# Patient Record
Sex: Male | Born: 1985 | Race: White | Hispanic: No | Marital: Married | Smoking: Never smoker
Health system: Southern US, Community
[De-identification: ages and names within clinical notes are randomized; demographics above are authoritative.]

## PROBLEM LIST (undated history)

## (undated) DIAGNOSIS — Z789 Other specified health status: Secondary | ICD-10-CM

## (undated) DIAGNOSIS — Z8619 Personal history of other infectious and parasitic diseases: Secondary | ICD-10-CM

## (undated) HISTORY — DX: Other specified health status: Z78.9

## (undated) HISTORY — DX: Personal history of other infectious and parasitic diseases: Z86.19

---
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
## 2004-08-09 ENCOUNTER — Ambulatory Visit: Payer: Self-pay | Admitting: Family Medicine

## 2009-01-16 ENCOUNTER — Emergency Department (HOSPITAL_COMMUNITY): Admission: EM | Admit: 2009-01-16 | Discharge: 2009-01-16 | Payer: Self-pay | Admitting: Emergency Medicine

## 2010-10-12 ENCOUNTER — Ambulatory Visit (INDEPENDENT_AMBULATORY_CARE_PROVIDER_SITE_OTHER): Payer: 59 | Admitting: Family Medicine

## 2010-10-12 ENCOUNTER — Ambulatory Visit: Payer: Self-pay | Admitting: Family Medicine

## 2010-10-12 ENCOUNTER — Telehealth: Payer: Self-pay | Admitting: Family Medicine

## 2010-10-12 ENCOUNTER — Encounter: Payer: Self-pay | Admitting: Family Medicine

## 2010-10-12 DIAGNOSIS — Z0000 Encounter for general adult medical examination without abnormal findings: Secondary | ICD-10-CM | POA: Insufficient documentation

## 2010-10-12 DIAGNOSIS — J309 Allergic rhinitis, unspecified: Secondary | ICD-10-CM | POA: Insufficient documentation

## 2010-10-12 NOTE — Telephone Encounter (Signed)
Please notify due for tetanus shot (last done 2000). Could come in at his convenience for Tdap or at next office visit.

## 2010-10-12 NOTE — Assessment & Plan Note (Signed)
Reviewed preventative protocols, updated unless pt declined. Will look into chart for last Tetanus - 2000.  Pt due for rpt. Will call. Had blood work done recently- advised to bring copy for records.
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
## 2010-10-12 NOTE — Assessment & Plan Note (Addendum)
Recent episode ? Bronchitis. Rest of story sounds like allergies, currently seem controlled with benadryl.  Has tried claritin and allegra per pt with poor response. May benefit from INS - will ask to return when feeling poorly for further evaluation. PF today normal, doubt asthma. Possible cat allergy, however not constant sxs.

## 2010-10-12 NOTE — Patient Instructions (Signed)
Good to meet you today.  Call us with questions. Try to get records of blood work done at work. Next time you're feeling ill, return to see [place]. I hope your wife starts feeling better. I will look into last tetanus shot and update your chart. Return to see me in 2-3 years or as needed.

## 2010-10-13 NOTE — Telephone Encounter (Signed)
Patient notified and nurse visit scheduled.

## 2010-10-14 ENCOUNTER — Ambulatory Visit (INDEPENDENT_AMBULATORY_CARE_PROVIDER_SITE_OTHER): Payer: 59 | Admitting: Family Medicine

## 2010-10-14 DIAGNOSIS — Z23 Encounter for immunization: Secondary | ICD-10-CM

## 2010-10-14 NOTE — Progress Notes (Signed)
TDap given IM left deltoid without incident.

## 2010-10-14 NOTE — Progress Notes (Signed)
  Subjective:    Patient ID: Connor Herrera, male    DOB: Apr 10, 1985, 25 y.o.   MRN: [id number]  HPI    Review of Systems     Objective:   Physical Exam        Assessment & Plan:

## 2010-10-18 ENCOUNTER — Ambulatory Visit (INDEPENDENT_AMBULATORY_CARE_PROVIDER_SITE_OTHER): Payer: 59 | Admitting: Family Medicine

## 2010-10-18 ENCOUNTER — Encounter: Payer: Self-pay | Admitting: *Deleted

## 2010-10-18 VITALS — BP 104/76 | HR 80 | Temp 99.0°F | Wt 211.0 lb

## 2010-10-18 DIAGNOSIS — H9319 Tinnitus, unspecified ear: Secondary | ICD-10-CM

## 2010-10-18 DIAGNOSIS — H9312 Tinnitus, left ear: Secondary | ICD-10-CM | POA: Insufficient documentation
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
## 2010-10-18 NOTE — Patient Instructions (Addendum)
Exam looks normal, hearing seems normal. Let's give this more time.  If worsening, let me know.  If not improving as expected by Friday, let us know.

## 2010-10-21 ENCOUNTER — Telehealth: Payer: Self-pay | Admitting: *Deleted

## 2010-10-21 DIAGNOSIS — H9312 Tinnitus, left ear: Secondary | ICD-10-CM

## 2010-10-21 NOTE — Telephone Encounter (Signed)
Pt states he was told to call today with update.  He still has ringing in his left ear and sounds are muffled.  No drainage from the ear.
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
## 2010-10-21 NOTE — Telephone Encounter (Signed)
That's fine.  I think completely appropriate. Will recommend more time, encourage pt to use ear plugs whenever planning on exposure to loud noises.   If improving with time, ok to wait.  Would want referral to ENT if ringing staying present or if worsening.

## 2010-10-21 NOTE — Telephone Encounter (Signed)
Message left advising patient to call and advise if he has had any improvement or if the ringing has stayed the same. Also advised about possible ENT referral and to expect call from [facility] next week.

## 2010-10-21 NOTE — Telephone Encounter (Signed)
Is there any improvement or change in ringing in last 2 days?  If not will want to refer to ENT (likely next week).

## 2010-10-27 NOTE — Telephone Encounter (Signed)
Addended by: [name] on: 10/27/2010 01:34 PM   Modules accepted: Orders

## 2010-10-27 NOTE — Telephone Encounter (Signed)
Have placed referral in chart.  Will route to [facility].

## 2010-10-27 NOTE — Telephone Encounter (Signed)
Pt called back, he says the ringing is still there and he now hears it in both ears.  He does want the ENT referral, wants to see someone in [place].

## 2011-02-19 IMAGING — CR DG KNEE COMPLETE 4+V*L*
4 series · 4 of 4 positions shown · non-contrast
Comparison: None

CLINICAL DATA: Laceration

LEFT KNEE - COMPLETE 4+ VIEW

[t knee ap left]
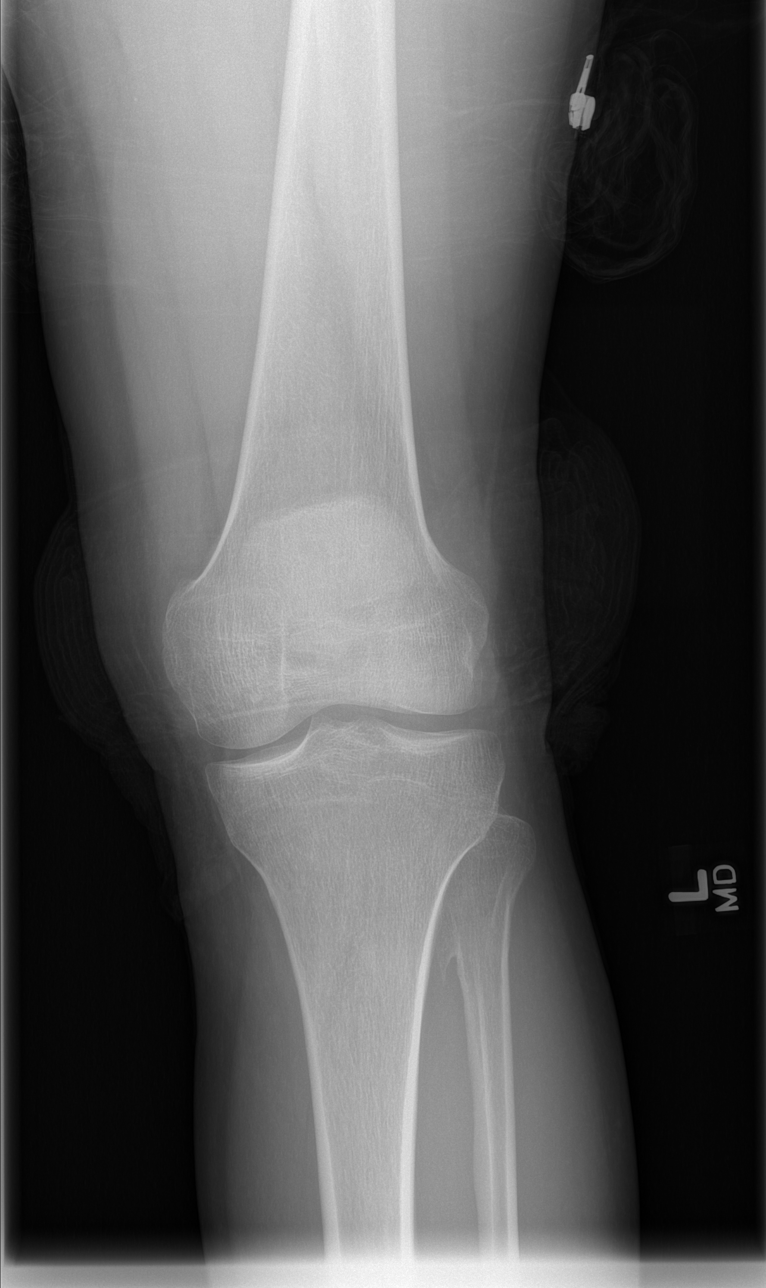

[t knee oblique left (1 of 2)]
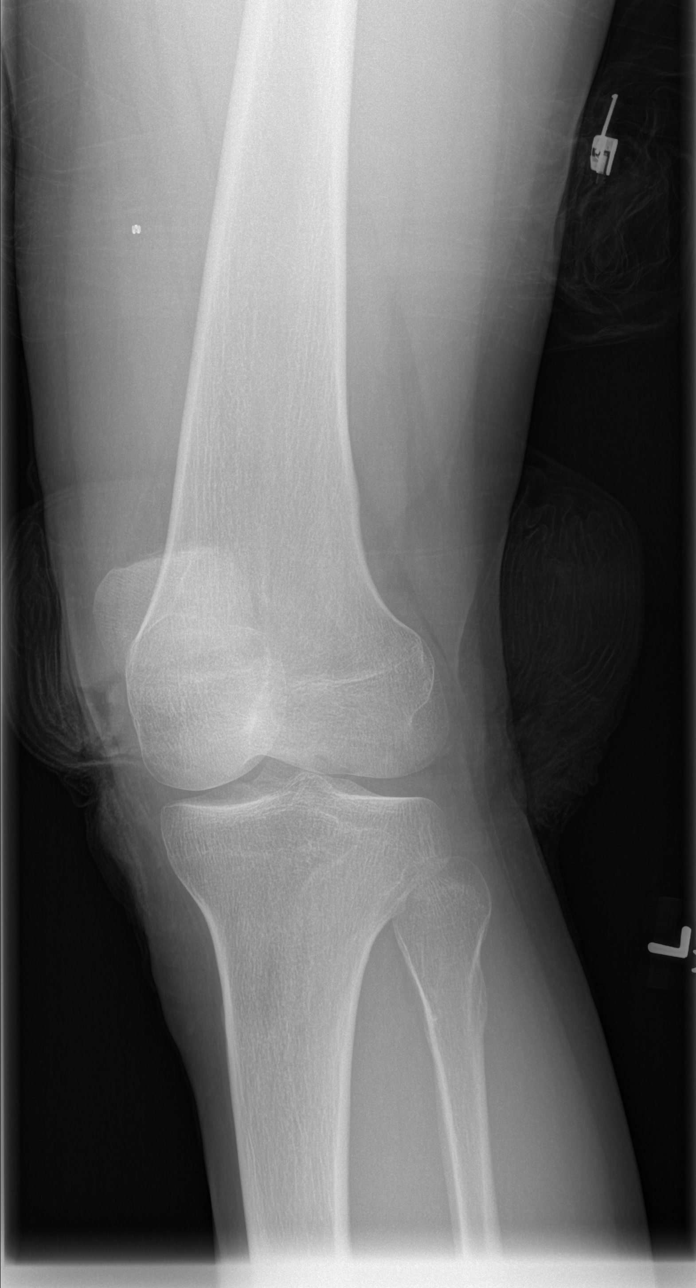

[t knee oblique left (2 of 2)]
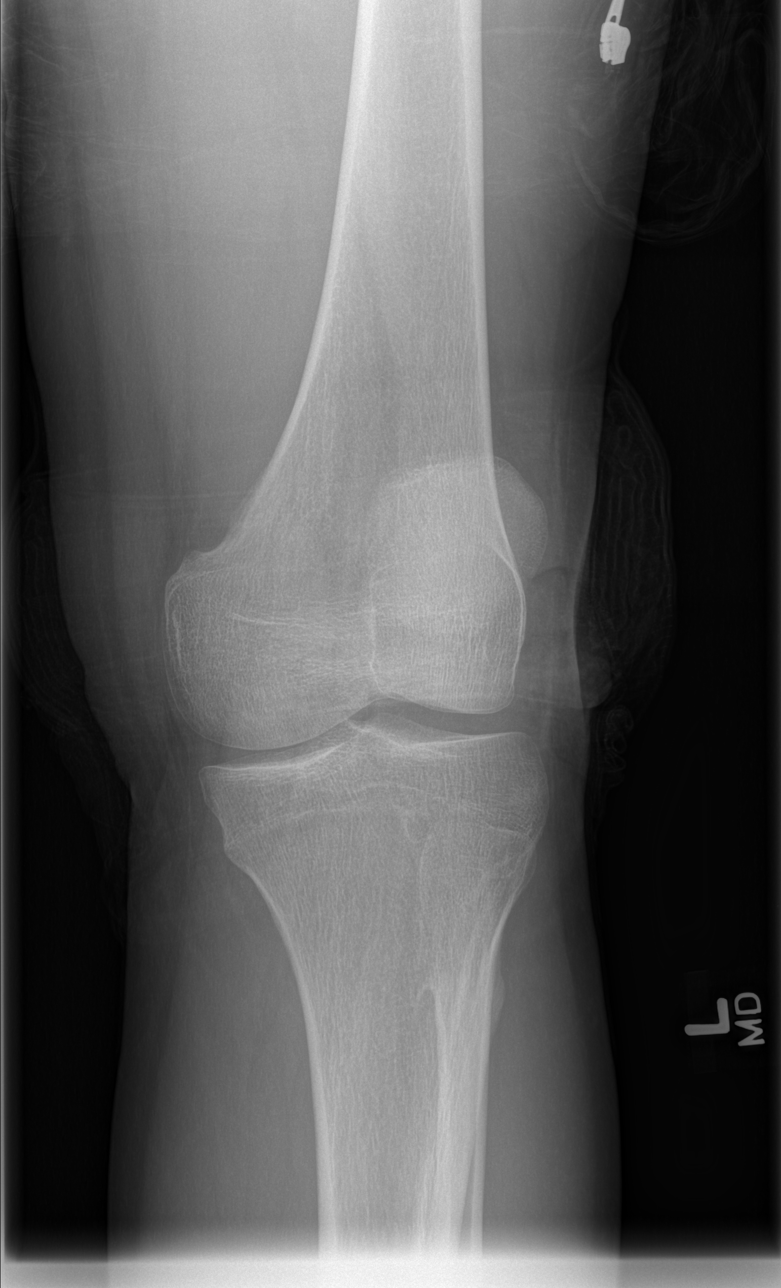

[t knee lat left]
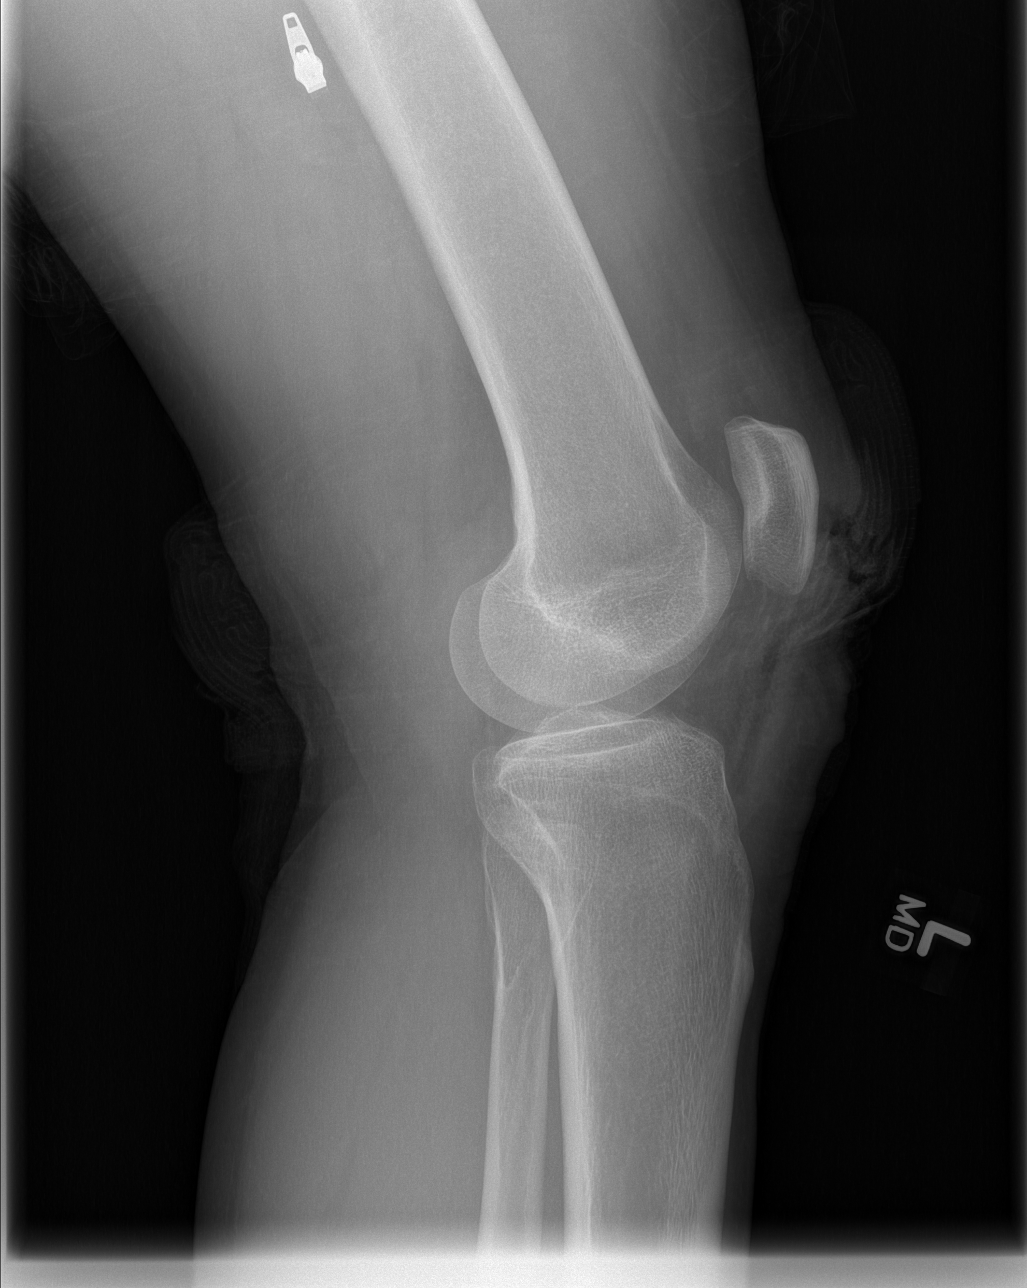

[4 of 4 positions shown; findings below may reference images not displayed]

FINDINGS: Soft tissue injury is seen anterior to the patella.  No
evidence of patellar fracture.  No radiopaque foreign object.
There is a small amount of fluid in the joint but no air seen in
the joint.  No fracture of the femur, tibia or fibula.  There is a
small osteochondroma of the proximal fibula incidentally noted.
IMPRESSION: Soft tissue injury anterior to the patella.  No sign of patellar
fracture.  Small amount of fluid in the knee joint but no sign of
air / gas in the joint.

## 2011-03-29 ENCOUNTER — Ambulatory Visit (INDEPENDENT_AMBULATORY_CARE_PROVIDER_SITE_OTHER): Payer: 59 | Admitting: Family Medicine

## 2011-03-29 ENCOUNTER — Encounter: Payer: Self-pay | Admitting: Family Medicine

## 2011-03-29 VITALS — BP 124/76 | HR 88 | Temp 98.7°F | Wt 222.8 lb

## 2011-03-29 DIAGNOSIS — J019 Acute sinusitis, unspecified: Secondary | ICD-10-CM

## 2011-03-29 MED ORDER — AMOXICILLIN 875 MG PO TABS: 875.0000 mg | ORAL_TABLET | Freq: Two times a day (BID) | ORAL | Status: AC

## 2011-03-29 NOTE — Patient Instructions (Signed)
Drink plenty of fluids, take tylenol as needed, and use nasal saline and start the amoxil today.  This should gradually improve.  Take care.  Let us know if you have other concerns.

## 2011-03-29 NOTE — Progress Notes (Signed)
Sick since [place], he'll get better but then he'll get worse.  Two kids in daycare.  Rhinorrhea, cough, stuffy.  Recently with more cough and inc in sinus pressure, R sided more then L. Nonsmoker. No h/o asthma.    Meds, vitals, and allergies reviewed.   ROS: See HPI.  Otherwise, noncontributory.  GEN: nad, alert and oriented HEENT: mucous membranes moist, tm w/o erythema, chronic changes on L Tm noted, nasal exam w/o erythema, clear discharge noted,  OP with cobblestoning NECK: supple w/o LA CV: rrr.   PULM: ctab, no inc wob EXT: no edema SKIN: no acute rash

## 2011-03-29 NOTE — Assessment & Plan Note (Addendum)
Amoxil, nasal saline, rest and fluids. Nontoxic. D/w pt, he understood.

## 2011-12-19 ENCOUNTER — Ambulatory Visit (INDEPENDENT_AMBULATORY_CARE_PROVIDER_SITE_OTHER): Payer: 59 | Admitting: Family Medicine

## 2011-12-19 ENCOUNTER — Encounter: Payer: Self-pay | Admitting: Family Medicine

## 2011-12-19 VITALS — BP 112/70 | HR 80 | Temp 98.3°F | Ht 74.0 in | Wt 224.8 lb

## 2011-12-19 DIAGNOSIS — J019 Acute sinusitis, unspecified: Secondary | ICD-10-CM

## 2011-12-19 DIAGNOSIS — Z0000 Encounter for general adult medical examination without abnormal findings: Secondary | ICD-10-CM

## 2011-12-19 DIAGNOSIS — Z23 Encounter for immunization: Secondary | ICD-10-CM

## 2011-12-19 LAB — LIPID PANEL
Cholesterol: 187 mg/dL (ref 0–200)
HDL: 36.9 mg/dL — ABNORMAL LOW (ref >39.00)
LDL Cholesterol: 131 mg/dL — ABNORMAL HIGH (ref 0–99)
Total CHOL/HDL Ratio: 5
Triglycerides: 98 mg/dL (ref 0.0–149.0)
VLDL: 19.6 mg/dL (ref 0.0–40.0)

## 2011-12-19 LAB — BASIC METABOLIC PANEL
BUN: 16 mg/dL (ref 6–23)
CO2: 28 mEq/L (ref 19–32)
Calcium: 9.9 mg/dL (ref 8.4–10.5)
Chloride: 103 mEq/L (ref 96–112)
Creatinine, Ser: 1.2 mg/dL (ref 0.4–1.5)
GFR: 78.65 mL/min (ref >60.00)
Glucose, Bld: 101 mg/dL — ABNORMAL HIGH (ref 70–99)
Potassium: 5.6 mEq/L — ABNORMAL HIGH (ref 3.5–5.1)
Sodium: 139 mEq/L (ref 135–145)

## 2011-12-19 NOTE — Assessment & Plan Note (Signed)
Preventative protocols reviewed and updated unless pt declined. Discussed healthy diet and lifestyle. Healthy 26 yo.  Recommended keep eye on weight.

## 2011-12-19 NOTE — Progress Notes (Signed)
Subjective:    Patient ID: Connor Herrera, male    DOB: [birth year], 26 y.o.   MRN: [id number]  HPI CC: CPE  Sinusitis - battling currently, for last 9 days, taking mucinex for this.  R maxillary pain as well as sinus congestion.  No fevers/chills, green mucous in AMs.  + HA and coughing.  Thinks overall improving slowly.  Prevetantive: Flu shot today. Tdap 2012. Requests screening blood work. Body mass index is 28.86 kg/(m^2).  Seat belt 100% time. Sunscreen consistently.  Wt Readings from Last 3 Encounters:  12/19/11 224 lb 12 oz (101.946 kg)  03/29/11 222 lb 12 oz (101.039 kg)  10/18/10 211 lb (95.709 kg)   Caffeine: 3 cups soda/day Lives with wife, 2 children, 2 cats, 3 dogs Occupation: works at [employer], Public house manager Activity: chasing kids, hunts, no regular exercise Diet: fruits, vegetables daily, lots of water, small portion sizes, red meat 3x/wk, fish 1x/wk  Medications and allergies reviewed and updated in chart.  Past histories reviewed and updated if relevant as below. Patient Active Problem List  Diagnosis  . Healthcare maintenance  . Tinnitus, left  . Sinusitis acute   Past Medical History  Diagnosis Date  . History of chicken pox    Past Surgical History  Procedure Date  . Wrist surgery 2002    left fracture skateboarding  . Inner ear surgery 2001    left traumatic ruptured TM, lacross ball, residual tinnitus   History  Substance Use Topics  . Smoking status: Never Smoker   . Smokeless tobacco: Never Used  . Alcohol Use: Yes     Comment: 6 drinks/weekend   Family History  Problem Relation Age of Onset  . Arthritis Paternal Grandmother   . Diabetes Paternal Grandmother   . Cancer Paternal Grandmother     breast  . Kidney disease Paternal Grandfather   . Coronary artery disease Paternal Grandfather 31  . Diabetes Father   . Stroke Neg Hx    No Known Allergies Current Outpatient Prescriptions on File Prior to Visit  Medication Sig  Dispense Refill  . Phenylephrine-DM-GG-APAP (MUCINEX FAST-MAX COLD FLU PO) Take by mouth as needed.          Review of Systems  Constitutional: Negative for fever, chills, activity change, appetite change, fatigue and unexpected weight change.  HENT: Negative for hearing loss and neck pain.   Eyes: Negative for visual disturbance.  Respiratory: Positive for cough. Negative for chest tightness, shortness of breath and wheezing.   Cardiovascular: Negative for chest pain, palpitations and leg swelling.  Gastrointestinal: Negative for nausea, vomiting, abdominal pain, diarrhea, constipation, blood in stool and abdominal distention.  Genitourinary: Negative for hematuria and difficulty urinating.  Musculoskeletal: Negative for myalgias and arthralgias.  Skin: Negative for rash.  Neurological: Positive for headaches. Negative for dizziness, seizures and syncope.  Hematological: Negative for adenopathy. Does not bruise/bleed easily.  Psychiatric/Behavioral: Negative for dysphoric mood. The patient is not nervous/anxious.        Objective:   Physical Exam  Nursing note and vitals reviewed. Constitutional: He is oriented to person, place, and time. He appears well-developed and well-nourished. No distress.  HENT:  Head: Normocephalic and atraumatic.  Right Ear: Hearing, tympanic membrane, external ear and ear canal normal.  Left Ear: Hearing, tympanic membrane, external ear and ear canal normal.  Nose: Nose normal.  Mouth/Throat: Oropharynx is clear and moist. No oropharyngeal exudate.  Eyes: Conjunctivae normal and EOM are normal. Pupils are equal, round, and reactive to light.  No scleral icterus.  Neck: Normal range of motion. Neck supple. No thyromegaly present.  Cardiovascular: Normal rate, regular rhythm, normal heart sounds and intact distal pulses.   No murmur heard. Pulses:      Radial pulses are 2+ on the right side, and 2+ on the left side.  Pulmonary/Chest: Effort normal and  breath sounds normal. No respiratory distress. He has no wheezes. He has no rales.  Abdominal: Soft. Bowel sounds are normal. He exhibits no distension and no mass. There is no tenderness. There is no rebound and no guarding.  Musculoskeletal: Normal range of motion. He exhibits no edema.  Lymphadenopathy:    He has no cervical adenopathy.  Neurological: He is alert and oriented to person, place, and time.       CN grossly intact, station and gait intact  Skin: Skin is warm and dry. No rash noted.  Psychiatric: He has a normal mood and affect. His behavior is normal. Judgment and thought content normal.       Assessment & Plan:

## 2011-12-19 NOTE — Assessment & Plan Note (Signed)
Of 8-9d duration.  Per pt, improving on its own. Discussed if persisting past 10 d or any sudden worsening, or fever >101, to call us for abx.

## 2011-12-19 NOTE — Patient Instructions (Addendum)
Blood work today. Flu shot today. Good to see you today, call us with questions. We will hold on to form until blood work returns then fax in for you.

## 2011-12-20 ENCOUNTER — Other Ambulatory Visit: Payer: Self-pay | Admitting: Family Medicine

## 2011-12-20 DIAGNOSIS — E875 Hyperkalemia: Secondary | ICD-10-CM

## 2011-12-26 ENCOUNTER — Other Ambulatory Visit (INDEPENDENT_AMBULATORY_CARE_PROVIDER_SITE_OTHER): Payer: 59

## 2011-12-26 DIAGNOSIS — E875 Hyperkalemia: Secondary | ICD-10-CM

## 2013-02-13 DIAGNOSIS — Z789 Other specified health status: Secondary | ICD-10-CM

## 2013-02-13 HISTORY — DX: Other specified health status: Z78.9

## 2013-03-01 ENCOUNTER — Encounter (HOSPITAL_COMMUNITY): Payer: Self-pay | Admitting: Emergency Medicine

## 2013-03-01 ENCOUNTER — Emergency Department (HOSPITAL_COMMUNITY)
Admission: EM | Admit: 2013-03-01 | Discharge: 2013-03-01 | Disposition: A | Discharge status: Home / Self Care | Payer: 59 | Attending: Emergency Medicine | Admitting: Emergency Medicine

## 2013-03-01 DIAGNOSIS — F1010 Alcohol abuse, uncomplicated: Secondary | ICD-10-CM

## 2013-03-01 DIAGNOSIS — Z8619 Personal history of other infectious and parasitic diseases: Secondary | ICD-10-CM | POA: Insufficient documentation

## 2013-03-01 LAB — GLUCOSE, CAPILLARY: Glucose-Capillary: 93 mg/dL (ref 70–99)

## 2013-03-01 LAB — BASIC METABOLIC PANEL
BUN: 15 mg/dL (ref 6–23)
CHLORIDE: 97 meq/L (ref 96–112)
CO2: 21 mEq/L (ref 19–32)
Calcium: 8.5 mg/dL (ref 8.4–10.5)
Creatinine, Ser: 1.03 mg/dL (ref 0.50–1.35)
GFR calc non Af Amer: >90 mL/min (ref >90)
Glucose, Bld: 90 mg/dL (ref 70–99)
POTASSIUM: 3.8 meq/L (ref 3.7–5.3)
Sodium: 137 mEq/L (ref 137–147)

## 2013-03-01 LAB — CBC WITH DIFFERENTIAL/PLATELET
BASOS ABS: 0 10*3/uL (ref 0.0–0.1)
BASOS PCT: 0 % (ref 0–1)
Eosinophils Absolute: 0 10*3/uL (ref 0.0–0.7)
Eosinophils Relative: 0 % (ref 0–5)
HCT: 44.8 % (ref 39.0–52.0)
HEMOGLOBIN: 15.8 g/dL (ref 13.0–17.0)
Lymphocytes Relative: 14 % (ref 12–46)
Lymphs Abs: 1.4 10*3/uL (ref 0.7–4.0)
MCH: 32.4 pg (ref 26.0–34.0)
MCHC: 35.3 g/dL (ref 30.0–36.0)
MCV: 91.8 fL (ref 78.0–100.0)
MONOS PCT: 7 % (ref 3–12)
Monocytes Absolute: 0.6 10*3/uL (ref 0.1–1.0)
NEUTROS PCT: 79 % — AB (ref 43–77)
Neutro Abs: 7.8 10*3/uL — ABNORMAL HIGH (ref 1.7–7.7)
Platelets: 203 10*3/uL (ref 150–400)
RBC: 4.88 MIL/uL (ref 4.22–5.81)
RDW: 13 % (ref 11.5–15.5)
WBC: 9.9 10*3/uL (ref 4.0–10.5)

## 2013-03-01 LAB — ETHANOL: ALCOHOL ETHYL (B): 289 mg/dL — AB (ref 0–11)

## 2013-03-01 NOTE — ED Notes (Signed)
Pt and pt's wife comfortable with d/c and f/u instructions. No prescriptions given.

## 2013-03-01 NOTE — ED Provider Notes (Signed)
CSN: [id number]     Arrival date & time 03/01/13  2043 History   First MD Initiated Contact with Patient 03/01/13 2113     Chief Complaint  Patient presents with  . Loss of Consciousness  . Alcohol Intoxication   (Consider location/radiation/quality/duration/timing/severity/associated sxs/prior Treatment) Patient is a 28 y.o. male presenting with intoxication. The history is provided by the patient and a relative.  Alcohol Intoxication This is a new problem. The current episode started today. The problem occurs constantly. The problem has been unchanged. Pertinent negatives include no abdominal pain, anorexia, arthralgias, chest pain, chills, congestion, fatigue, fever, headaches, joint swelling, myalgias, nausea, neck pain, numbness, rash or vomiting. Nothing aggravates the symptoms. He has tried nothing for the symptoms. The treatment provided no relief.    Past Medical History  Diagnosis Date  . History of chicken pox    Past Surgical History  Procedure Laterality Date  . Wrist surgery  2002    left fracture skateboarding  . Inner ear surgery  2001    left traumatic ruptured TM, lacross ball, residual tinnitus   Family History  Problem Relation Age of Onset  . Arthritis Paternal Grandmother   . Diabetes Paternal Grandmother   . Cancer Paternal Grandmother     breast  . Kidney disease Paternal Grandfather   . Coronary artery disease Paternal Grandfather [id number]  . Diabetes Father   . Stroke Neg Hx    History  Substance Use Topics  . Smoking status: Never Smoker   . Smokeless tobacco: Never Used  . Alcohol Use: Yes     Comment: 6 drinks/weekend    Review of Systems  Constitutional: Negative for fever, chills and fatigue.  HENT: Negative for congestion and facial swelling.   Eyes: Negative for discharge and visual disturbance.  Respiratory: Negative for shortness of breath.   Cardiovascular: Negative for chest pain and palpitations.  Gastrointestinal: Negative for nausea,  vomiting, abdominal pain, diarrhea and anorexia.  Musculoskeletal: Negative for arthralgias, joint swelling, myalgias and neck pain.  Skin: Negative for color change and rash.  Neurological: Negative for tremors, syncope, numbness and headaches.  Psychiatric/Behavioral: Negative for confusion and dysphoric mood.    Allergies  Review of patient's allergies indicates no known allergies.  Home Medications   No current outpatient prescriptions on file. BP 131/91  Temp(Src) 97.7 F (36.5 C) (Oral)  Resp 16  Ht 6\' 2"  (1.88 m)  Wt 225 lb (102.059 kg)  BMI 28.88 kg/m2  SpO2 97% Physical Exam  Constitutional: He is oriented to person, place, and time. He appears well-developed and well-nourished.  HENT:  Head: Normocephalic and atraumatic.  Eyes: EOM are normal. Pupils are equal, round, and reactive to light.  Neck: Normal range of motion. Neck supple. No JVD present.  Cardiovascular: Normal rate and regular rhythm.  Exam reveals no gallop and no friction rub.   No murmur heard. Pulmonary/Chest: No respiratory distress. He has no wheezes.  Abdominal: He exhibits no distension. There is no rebound and no guarding.  Musculoskeletal: Normal range of motion.  Neurological: He is alert and oriented to person, place, and time. He has normal strength. No cranial nerve deficit or sensory deficit. GCS eye subscore is 4. GCS verbal subscore is 5. GCS motor subscore is 6. He displays no Babinski's sign on the right side. He displays no Babinski's sign on the left side.  Reflex Scores:      Tricep reflexes are 2+ on the right side and 2+ on the left side.  Bicep reflexes are 2+ on the right side and 2+ on the left side.      Brachioradialis reflexes are 2+ on the right side and 2+ on the left side.      Patellar reflexes are 2+ on the right side and 2+ on the left side.      Achilles reflexes are 2+ on the right side and 2+ on the left side. Skin: No rash noted. No pallor.  Psychiatric: He has  a normal mood and affect. His behavior is normal.    ED Course  Procedures (including critical care time) Labs Review Labs Reviewed  CBC WITH DIFFERENTIAL - Abnormal; Notable for the following:    Neutrophils Relative % 79 (*)    Neutro Abs 7.8 (*)    All other components within normal limits  GLUCOSE, CAPILLARY  BASIC METABOLIC PANEL  ETHANOL   Imaging Review No results found.  EKG Interpretation   None       MDM   1. ETOH abuse    Patient is a 28 y.o. male who presents with Loss of Consciousness and Alcohol Intoxication .  This started just prior to arrival.  Patient chopping wood all day, had about 7 alcoholic drinks, fell asleep on the toilet and fell off and landing on the floor next to the tub.  Family found him and felt like he was non responsive.   On exam patient appropriate, sober enough to make his own decisions, no midline spinal tenderness, c collar cleared, no noted signs of trauma to the face/head.  Patient ambulating and tolerating PO in the ED.  Discussed with family benefit vs risk of CT imaging of the head and neck.  Agree to return for sudden worsening pain, continued vomiting.    10:13 PM:  I have discussed the diagnosis/risks/treatment options with the patient and family and believe the pt to be eligible for discharge home to follow-up with PCP. We also discussed returning to the ED immediately if new or worsening sx occur. We discussed the sx which are most concerning that necessitate immediate return. Any new prescriptions provided to the patient are listed below.  New Prescriptions   No medications on file         Melene Plan, MD 03/01/13 2213

## 2013-03-01 NOTE — ED Notes (Signed)
Pt presents from home via [facility] with c/o syncopal episode and etoh. Per EMS pt has not eaten much today, was outdoors chopping wood all day, drank unknown amount of alcohol and had syncopal episode at 1945 this evening. Wife reports finding patient face-down on ground in bathroom and had multiple episodes of emesis s/p syncopal episode. No edema/lacerations noted to forehead, pt denies pain. Per wife, pt's two children have both tested positive for flu. Pt alert and oriented x4.

## 2013-03-01 NOTE — Discharge Instructions (Signed)
Alcohol Intoxication °Alcohol intoxication occurs when the amount of alcohol that a person has consumed impairs his or her ability to mentally and physically function. Alcohol directly impairs the normal chemical activity of the brain. Drinking large amounts of alcohol can lead to changes in mental function and behavior, and it can cause many physical effects that can be harmful.  °Alcohol intoxication can range in severity from mild to very severe. Various factors can affect the level of intoxication that occurs, such as the person's age, gender, weight, frequency of alcohol consumption, and the presence of other medical conditions (such as diabetes, seizures, or heart conditions). Dangerous levels of alcohol intoxication may occur when people drink large amounts of alcohol in a short period (binge drinking). Alcohol can also be especially dangerous when combined with certain prescription medicines or "recreational" drugs. °SIGNS AND SYMPTOMS °Some common signs and symptoms of mild alcohol intoxication include: °· Loss of coordination. °· Changes in mood and behavior. °· Impaired judgment. °· Slurred speech. °As alcohol intoxication progresses to more severe levels, other signs and symptoms will appear. These may include: °· Vomiting. °· Confusion and impaired memory. °· Slowed breathing. °· Seizures. °· Loss of consciousness. °DIAGNOSIS  °Your health care provider will take a medical history and perform a physical exam. You will be asked about the amount and type of alcohol you have consumed. Blood tests will be done to measure the concentration of alcohol in your blood. In many places, your blood alcohol level must be lower than 80 mg/dL (0.08%) to legally drive. However, many dangerous effects of alcohol can occur at much lower levels.  °TREATMENT  °People with alcohol intoxication often do not require treatment. Most of the effects of alcohol intoxication are temporary, and they go away as the alcohol naturally  leaves the body. Your health care provider will monitor your condition until you are stable enough to go home. Fluids are sometimes given through an IV access tube to help prevent dehydration.  °HOME CARE INSTRUCTIONS °· Do not drive after drinking alcohol. °· Stay hydrated. Drink enough water and fluids to keep your urine clear or pale yellow. Avoid caffeine.   °· Only take over-the-counter or prescription medicines as directed by your health care provider.   °SEEK MEDICAL CARE IF:  °· You have persistent vomiting.   °· You do not feel better after a few days. °· You have frequent alcohol intoxication. Your health care provider can help determine if you should see a substance use treatment counselor. °SEEK IMMEDIATE MEDICAL CARE IF:  °· You become shaky or tremble when you try to stop drinking.   °· You shake uncontrollably (seizure).   °· You throw up (vomit) blood. This may be bright red or may look like black coffee grounds.   °· You have blood in your stool. This may be bright red or may appear as a black, tarry, bad smelling stool.   °· You become lightheaded or faint.   °MAKE SURE YOU:  °· Understand these instructions. °· Will watch your condition. °· Will get help right away if you are not doing well or get worse. °Document Released: 11/09/2004 Document Revised: 10/02/2012 Document Reviewed: 07/05/2012 °[employer]® Patient Information ©2014 [employer]. ° °

## 2013-03-01 NOTE — ED Provider Notes (Signed)
I saw and evaluated the patient, reviewed the resident's note and I agree with the findings and plan.   .Face to face Exam:  General:  Awake HEENT:  Atraumatic Resp:  Normal effort Abd:  Nondistended Neuro:No focal weakness    Nelia Shiobert L Ilianna Bown, MD 03/01/13 2220

## 2013-03-01 NOTE — ED Notes (Signed)
Pt ambulatory in hallway without issue. ED Resident states OK for discharge at this time.

## 2013-03-02 ENCOUNTER — Encounter: Payer: Self-pay | Admitting: Family Medicine

## 2014-06-16 ENCOUNTER — Encounter: Payer: Self-pay | Admitting: Family Medicine

## 2014-06-16 ENCOUNTER — Ambulatory Visit (INDEPENDENT_AMBULATORY_CARE_PROVIDER_SITE_OTHER): Payer: 59 | Admitting: Family Medicine

## 2014-06-16 VITALS — BP 120/74 | HR 68 | Temp 98.4°F | Wt 220.4 lb

## 2014-06-16 DIAGNOSIS — S39012A Strain of muscle, fascia and tendon of lower back, initial encounter: Secondary | ICD-10-CM | POA: Diagnosis not present

## 2014-06-16 DIAGNOSIS — M545 Low back pain, unspecified: Secondary | ICD-10-CM

## 2014-06-16 LAB — POCT URINALYSIS DIPSTICK
Bilirubin, UA: NEGATIVE
Glucose, UA: NEGATIVE
Ketones, UA: NEGATIVE
Leukocytes, UA: NEGATIVE
Nitrite, UA: NEGATIVE
Protein, UA: NEGATIVE
RBC UA: NEGATIVE
UROBILINOGEN UA: 0.2
pH, UA: 6

## 2014-06-16 MED ORDER — METHOCARBAMOL 500 MG PO TABS: 500.0000 mg | ORAL_TABLET | Freq: Three times a day (TID) | ORAL | Status: DC | PRN

## 2014-06-16 NOTE — Patient Instructions (Signed)
I think you have lumbar strain of back - treat with ibuprofen 400-600mg  twice daily with food for next week, may use muscle relaxant sent to pharmacy (sedation precautions), and do exercises provided today. This should continue to get better over next 2 weeks. You could also see chiropractor if not better with above treatment. Let us know if any worsening or not improving as expected.

## 2014-06-16 NOTE — Assessment & Plan Note (Addendum)
Upper lumbar strain - treat with ibuprofen 400-600mg  twice daily with food, robaxin muscle relaxant. Lower back pain provided today. Update if not improving with treatment. Discussed chiropractor No red flags today.

## 2014-06-16 NOTE — Progress Notes (Signed)
   BP 120/74 mmHg  Pulse 68  Temp(Src) 98.4 F (36.9 C) (Oral)  Wt 220 lb 6.4 oz (99.973 kg)   CC: back pain  Subjective:    Patient ID: Connor Herrera, male    DOB: 07/23/1985, 29 y.o.   MRN: [id number]  HPI: Connor CalamityChristopher L Herrera is a 29 y.o. male presenting on 06/16/2014 for Back Pain   3-4 wk h/o lower back pain - described as dull ache intersperced with sharp pain. Points to lower thoracic region as site of pain. 1 wk ago had sharp pain left lower back that shot laterally.   Denies inciting trauma/falls or injury. No fevers/chills, shooting pain down legs, numbness or weakness of legs, bowel or bladder accidents.  No prior back issues.  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 Has been in this position for last 4 months.   Relevant past medical, surgical, family and social history reviewed and updated as indicated. Interim medical history since our last visit reviewed. Allergies and medications reviewed and updated. No current outpatient prescriptions on file prior to visit.   No current facility-administered medications on file prior to visit.    Review of Systems Per HPI unless specifically indicated above     Objective:    BP 120/74 mmHg  Pulse 68  Temp(Src) 98.4 F (36.9 C) (Oral)  Wt 220 lb 6.4 oz (99.973 kg)  Wt Readings from Last 3 Encounters:  06/16/14 220 lb 6.4 oz (99.973 kg)  03/01/13 225 lb (102.059 kg)  12/19/11 224 lb 12 oz (101.946 kg)    Physical Exam  Constitutional: He is oriented to person, place, and time. He appears well-developed and well-nourished. No distress.  Musculoskeletal: He exhibits no edema.  No pain midline spine No paraspinous mm tenderness Neg SLR bilaterally. No pain with int/ext rotation at hip. Neg FABER. No pain at SIJ, GTB or sciatic notch bilaterally.   Neurological: He is alert and oriented to person, place, and time. He has normal strength. No sensory deficit. Coordination and gait normal.  5/5 strength BLE    Nursing note and vitals reviewed.     Assessment & Plan:   Problem List Items Addressed This Visit    Lumbar strain    Upper lumbar strain - treat with ibuprofen 400-600mg  twice daily with food, robaxin muscle relaxant. Lower back pain provided today. Update if not improving with treatment. Discussed chiropractor No red flags today.       Other Visit Diagnoses    Midline low back pain without sciatica    -  Primary    Relevant Medications    methocarbamol (ROBAXIN) 500 MG tablet    Other Relevant Orders    POCT Urinalysis Dipstick        Follow up plan: Return if symptoms worsen or fail to improve.

## 2014-06-16 NOTE — Progress Notes (Signed)
Pre visit review using our clinic review tool, if applicable. No additional management support is needed unless otherwise documented below in the visit note. 

## 2014-06-17 ENCOUNTER — Encounter: Payer: Self-pay | Admitting: *Deleted

## 2015-05-13 LAB — LIPID PANEL
CHOLESTEROL: 183
HDL: 44
LDL CALC: 105
TRIGLYCERIDES: 171

## 2015-05-13 LAB — GLUCOSE, FASTING: GLUCOSE: 127

## 2015-05-20 ENCOUNTER — Ambulatory Visit (INDEPENDENT_AMBULATORY_CARE_PROVIDER_SITE_OTHER): Payer: 59 | Admitting: Family Medicine

## 2015-05-20 ENCOUNTER — Encounter: Payer: Self-pay | Admitting: Family Medicine

## 2015-05-20 ENCOUNTER — Encounter: Payer: Self-pay | Admitting: *Deleted

## 2015-05-20 VITALS — BP 114/82 | HR 72 | Temp 98.2°F | Wt 217.5 lb

## 2015-05-20 DIAGNOSIS — E781 Pure hyperglyceridemia: Secondary | ICD-10-CM

## 2015-05-20 DIAGNOSIS — R739 Hyperglycemia, unspecified: Secondary | ICD-10-CM | POA: Diagnosis not present

## 2015-05-20 DIAGNOSIS — E785 Hyperlipidemia, unspecified: Secondary | ICD-10-CM | POA: Insufficient documentation

## 2015-05-20 NOTE — Patient Instructions (Signed)
labwork today - we will call you with results.

## 2015-05-20 NOTE — Assessment & Plan Note (Signed)
Mild (from work labs) - trig 170.

## 2015-05-20 NOTE — Assessment & Plan Note (Signed)
BMI 27. Does have fmhx DM. Recheck today (BMP, A1c). Discussed healthy diet choices and lifestyle changes to better control sugar levels.

## 2015-05-20 NOTE — Progress Notes (Signed)
Pre visit review using our clinic review tool, if applicable. No additional management support is needed unless otherwise documented below in the visit note. 

## 2015-05-20 NOTE — Progress Notes (Signed)
   BP 114/82 mmHg  Pulse 72  Temp(Src) 98.2 F (36.8 C) (Oral)  Wt 217 lb 8 oz (98.657 kg)   CC: check sugars  Subjective:    Patient ID: Connor Herrera, male    DOB: [birth year], 30 y.o.   MRN: [id number]  HPI: Connor Herrera is a 30 y.o. male presenting on 05/20/2015 for Follow-up   Brings screening results from work - normal cholesterol levels except for trig 171. LDL 105. Fasting glucose 127.   Weight stable. Using isogenic's shake twice daily.  No regular exercise. Some walking.  Good water daily, fruits/vegetables daily through shake.   Father with diabetes.   Denies polyphagia, polydipsia, polyuria. No unexpected weight changes  Relevant past medical, surgical, family and social history reviewed and updated as indicated. Interim medical history since our last visit reviewed. Allergies and medications reviewed and updated. No current outpatient prescriptions on file prior to visit.   No current facility-administered medications on file prior to visit.    Review of Systems Per HPI unless specifically indicated in ROS section     Objective:    BP 114/82 mmHg  Pulse 72  Temp(Src) 98.2 F (36.8 C) (Oral)  Wt 217 lb 8 oz (98.657 kg)  Wt Readings from Last 3 Encounters:  05/20/15 217 lb 8 oz (98.657 kg)  06/16/14 220 lb 6.4 oz (99.973 kg)  03/01/13 225 lb (102.059 kg)   Body mass index is 27.91 kg/(m^2).   Physical Exam  Constitutional: He appears well-developed and well-nourished. No distress.  HENT:  Mouth/Throat: Oropharynx is clear and moist. No oropharyngeal exudate.  Neck: No thyromegaly present.  Cardiovascular: Normal rate, regular rhythm, normal heart sounds and intact distal pulses.   No murmur heard. Pulmonary/Chest: Effort normal and breath sounds normal. No respiratory distress. He has no wheezes. He has no rales.  Musculoskeletal: He exhibits no edema.  Psychiatric: He has a normal mood and affect.  Nursing note and vitals  reviewed.      Assessment & Plan:   Problem List Items Addressed This Visit    Hyperglycemia - Primary    BMI 27. Does have fmhx DM. Recheck today (BMP, A1c). Discussed healthy diet choices and lifestyle changes to better control sugar levels.      Relevant Orders   Basic metabolic panel   Hemoglobin A1c   Hypertriglyceridemia    Mild (from work labs) - trig 170.          Follow up plan: No Follow-up on file.  Connor BoydenJavier Alaysia Lightle, MD

## 2015-05-21 LAB — BASIC METABOLIC PANEL
BUN: 20 mg/dL (ref 6–23)
CO2: 28 mEq/L (ref 19–32)
Calcium: 10 mg/dL (ref 8.4–10.5)
Chloride: 101 mEq/L (ref 96–112)
Creatinine, Ser: 1.24 mg/dL (ref 0.40–1.50)
GFR: 71.87 mL/min (ref >60.00)
Glucose, Bld: 78 mg/dL (ref 70–99)
Potassium: 4.4 mEq/L (ref 3.5–5.1)
SODIUM: 137 meq/L (ref 135–145)

## 2015-05-21 LAB — HEMOGLOBIN A1C: HEMOGLOBIN A1C: 5.6 % (ref 4.6–6.5)

## 2015-05-24 ENCOUNTER — Encounter: Payer: Self-pay | Admitting: *Deleted

## 2017-08-20 ENCOUNTER — Encounter: Payer: Self-pay | Admitting: Family Medicine

## 2017-08-20 ENCOUNTER — Ambulatory Visit: Payer: 59 | Admitting: Family Medicine

## 2017-08-20 ENCOUNTER — Encounter (INDEPENDENT_AMBULATORY_CARE_PROVIDER_SITE_OTHER): Payer: Self-pay

## 2017-08-20 VITALS — BP 126/82 | HR 86 | Temp 98.5°F | Ht 74.0 in | Wt 242.5 lb

## 2017-08-20 DIAGNOSIS — B070 Plantar wart: Secondary | ICD-10-CM | POA: Insufficient documentation

## 2017-08-20 NOTE — Progress Notes (Addendum)
   BP 126/82 (BP Location: Left Arm, Patient Position: Sitting, Cuff Size: Large)   Pulse 86   Temp 98.5 F (36.9 C) (Oral)   Ht 6\' 2"  (1.88 m)   Wt 242 lb 8 oz (110 kg)   SpO2 98%   BMI 31.14 kg/m    CC: plantar wart Subjective:    Patient ID: Connor Herrera, male    DOB: 1985-11-25, 32 y.o.   MRN: [id number]  HPI: Connor Herrera is a 32 y.o. male presenting on 08/20/2017 for Plantar Warts (Located on left foot. Noticed about 2 mos ago, Tried OTC products, no help. )   Several month h/o L plantar warts - treating with OTC acid solution, also tried freeze off without benefit. Would like further treatment today.  Relevant past medical, surgical, family and social history reviewed and updated as indicated. Interim medical history since our last visit reviewed. Allergies and medications reviewed and updated. No outpatient medications prior to visit.   No facility-administered medications prior to visit.      Per HPI unless specifically indicated in ROS section below Review of Systems     Objective:    BP 126/82 (BP Location: Left Arm, Patient Position: Sitting, Cuff Size: Large)   Pulse 86   Temp 98.5 F (36.9 C) (Oral)   Ht 6\' 2"  (1.88 m)   Wt 242 lb 8 oz (110 kg)   SpO2 98%   BMI 31.14 kg/m   Wt Readings from Last 3 Encounters:  08/20/17 242 lb 8 oz (110 kg)  05/20/15 217 lb 8 oz (98.7 kg)  06/16/14 220 lb 6.4 oz (100 kg)    Physical Exam  Constitutional: He appears well-developed and well-nourished. No distress.  Skin: Skin is warm and dry.  L sole at base of 1st MTPJ with 2 tender hyperkeratotic lesions with central core  Nursing note and vitals reviewed.  L plantar wart: Liquid nitrogen was applied for 10 seconds to the skin lesions and the expected blistering or scabbing reaction explained. Do not pick at the area. Return if lesions fail to fully resolve.     Assessment & Plan:   Problem List Items Addressed This Visit    Plantar wart of left  foot - Primary    Skin pared down with 15 blade scalpel then cryotherapy treatment performed to 2 plantar warts. Anticipated recovery reviewed. Update if not improving with treatment.           No orders of the defined types were placed in this encounter.  No orders of the defined types were placed in this encounter.   Follow up plan: No follow-ups on file.  Eustaquio BoydenJavier Mustapha Colson, MD

## 2017-08-20 NOTE — Assessment & Plan Note (Addendum)
Skin pared down with 15 blade scalpel then cryotherapy treatment performed to 2 plantar warts. Anticipated recovery reviewed. Update if not improving with treatment.

## 2017-08-20 NOTE — Patient Instructions (Addendum)
Cryotherapy performed today. Wound will blister then peel, hopefully wart will be gone.  If recurrent, may return for retreatment.  Plantar Warts Warts are small growths on the skin. They can occur on various areas of the body. When they occur on the underside (sole) of the foot, they are called plantar warts. Plantar warts often occur in groups, with several small warts around a larger growth. They tend to develop over areas of pressure, such as the heel or the ball of the foot. Most warts are not painful, and they usually do not cause problems. However, plantar warts may cause pain when you walk because pressure is applied to them. Warts often go away on their own in time. Various treatments may be done if needed. Sometimes, warts go away and then they come back again. What are the causes? Plantar warts are caused by a type of virus that is called human papillomavirus (HPV). HPV attacks a break in the skin of the foot. Walking barefoot can lead to exposure to the virus. These warts may spread to other areas of the sole. They spread to other areas of the body only through direct contact. What increases the risk? Plantar warts are more likely to develop in:  People who are 6610-34 years of age.  People who use public showers or locker rooms.  People who have a weakened body defense system (immune system).  What are the signs or symptoms? Plantar warts may be flat or slightly raised. They may grow into the deeper layers of skin or rise above the surface of the skin. Most plantar warts have a rough surface. They may cause pain when you use your foot to support your body weight. How is this diagnosed? A plantar wart can usually be diagnosed from its appearance. In some cases, a tissue sample may be removed (biopsy) to be looked at under a microscope. How is this treated? In many cases, warts do not need treatment. Without treatment, they often go away over a period of many months to a couple years.  If treatment is needed, options may include:  Applying medicated solutions, creams, or patches to the wart. These may be over-the-counter or prescription medicines that make the skin soft so that layers will gradually shed away. In many cases, the medicine is applied one or two times per day and covered with a bandage.  Putting duct tape over the top of the wart (occlusion). You will leave the tape in place for as long as told by your health care provider, then you will replace it with a new strip of tape. This is done until the wart goes away.  Freezing the wart with liquid nitrogen (cryotherapy).  Burning the wart with: ? Laser treatment. ? An electrified probe (electrocautery).  Injection of a medicine (Candida antigen) into the wart to help the body's immune system to fight off the wart.  Surgery to remove the wart.  Follow these instructions at home:  Apply medicated creams or solutions only as told by your health care provider. This may involve: ? Soaking the affected area in warm water. ? Removing the top layer of softened skin before you apply the medicine. A pumice stone works well for removing the tissue. ? Applying a bandage over the affected area after you apply the medicine. ? Repeating the process daily or as told by your health care provider.  Do not scratch or pick at a wart.  Wash your hands after you touch a wart.  If  a wart is painful, try applying a bandage with a hole in the middle over the wart. The helps to take pressure off the wart.  Keep all follow-up visits as told by your health care provider. This is important. How is this prevented? Take these actions to help prevent warts:  Wear shoes and socks. Change your socks daily.  Keep your feet clean and dry.  Check your feet regularly.  Avoid direct contact with warts on other people.  Contact a health care provider if:  Your warts do not improve after treatment.  You have redness, swelling, or  pain at the site of a wart.  You have bleeding from a wart that does not stop with light pressure.  You have diabetes and you develop a wart. This information is not intended to replace advice given to you by your health care provider. Make sure you discuss any questions you have with your health care provider. Document Released: 04/22/2003 Document Revised: 07/08/2015 Document Reviewed: 04/27/2014 Elsevier Interactive Patient Education  [employer].

## 2017-10-22 ENCOUNTER — Other Ambulatory Visit: Payer: Self-pay | Admitting: Family Medicine

## 2017-10-22 ENCOUNTER — Other Ambulatory Visit (INDEPENDENT_AMBULATORY_CARE_PROVIDER_SITE_OTHER): Payer: 59

## 2017-10-22 DIAGNOSIS — R739 Hyperglycemia, unspecified: Secondary | ICD-10-CM | POA: Diagnosis not present

## 2017-10-22 DIAGNOSIS — E781 Pure hyperglyceridemia: Secondary | ICD-10-CM

## 2017-10-22 LAB — HEMOGLOBIN A1C: Hgb A1c MFr Bld: 5.5 % (ref 4.6–6.5)

## 2017-10-22 LAB — COMPREHENSIVE METABOLIC PANEL
ALT: 38 U/L (ref 0–53)
AST: 26 U/L (ref 0–37)
Albumin: 4.4 g/dL (ref 3.5–5.2)
Alkaline Phosphatase: 67 U/L (ref 39–117)
BILIRUBIN TOTAL: 0.6 mg/dL (ref 0.2–1.2)
BUN: 15 mg/dL (ref 6–23)
CO2: 29 meq/L (ref 19–32)
Calcium: 9.3 mg/dL (ref 8.4–10.5)
Chloride: 101 mEq/L (ref 96–112)
Creatinine, Ser: 1.31 mg/dL (ref 0.40–1.50)
GFR: 66.46 mL/min (ref >60.00)
Glucose, Bld: 99 mg/dL (ref 70–99)
Potassium: 4.6 mEq/L (ref 3.5–5.1)
Sodium: 137 mEq/L (ref 135–145)
Total Protein: 7.1 g/dL (ref 6.0–8.3)

## 2017-10-22 LAB — LIPID PANEL
Cholesterol: 195 mg/dL (ref 0–200)
HDL: 53.6 mg/dL (ref >39.00)
LDL Cholesterol: 112 mg/dL — ABNORMAL HIGH (ref 0–99)
NONHDL: 141.11
Total CHOL/HDL Ratio: 4
Triglycerides: 144 mg/dL (ref 0.0–149.0)
VLDL: 28.8 mg/dL (ref 0.0–40.0)

## 2017-10-25 ENCOUNTER — Ambulatory Visit (INDEPENDENT_AMBULATORY_CARE_PROVIDER_SITE_OTHER): Payer: 59 | Admitting: Family Medicine

## 2017-10-25 ENCOUNTER — Encounter: Payer: Self-pay | Admitting: Family Medicine

## 2017-10-25 VITALS — BP 138/90 | HR 99 | Temp 98.2°F | Ht 72.5 in | Wt 238.8 lb

## 2017-10-25 DIAGNOSIS — Z23 Encounter for immunization: Secondary | ICD-10-CM

## 2017-10-25 DIAGNOSIS — Z0000 Encounter for general adult medical examination without abnormal findings: Secondary | ICD-10-CM

## 2017-10-25 DIAGNOSIS — E669 Obesity, unspecified: Secondary | ICD-10-CM | POA: Diagnosis not present

## 2017-10-25 DIAGNOSIS — R030 Elevated blood-pressure reading, without diagnosis of hypertension: Secondary | ICD-10-CM | POA: Insufficient documentation

## 2017-10-25 DIAGNOSIS — E785 Hyperlipidemia, unspecified: Secondary | ICD-10-CM | POA: Diagnosis not present

## 2017-10-25 NOTE — Assessment & Plan Note (Signed)
Preventative protocols reviewed and updated unless pt declined. Discussed healthy diet and lifestyle.  

## 2017-10-25 NOTE — Patient Instructions (Addendum)
You are doing well today.  Continue working on [employer] diet and exercise routine.  Return as needed or in 1 years for next physical.  Blood pressures a bit elevated today (140/90s) - start monitoring at home or at local pharmacy - goal <140/90. If consistently high, let me know. Work on aerobic exercise routine. Work on limiting salt/sodium in the diet, increase potassium rich foods (fruits/vegetables).   DASH Eating Plan DASH stands for "Dietary Approaches to Stop Hypertension." The DASH eating plan is a healthy eating plan that has been shown to reduce high blood pressure (hypertension). It may also reduce your risk for type 2 diabetes, heart disease, and stroke. The DASH eating plan may also help with weight loss. What are tips for following this plan? General guidelines  Avoid eating more than 2,300 mg (milligrams) of salt (sodium) a day. If you have hypertension, you may need to reduce your sodium intake to 1,500 mg a day.  Limit alcohol intake to no more than 1 drink a day for nonpregnant women and 2 drinks a day for men. One drink equals 12 oz of beer, 5 oz of wine, or 1 oz of hard liquor.  Work with your health care provider to maintain a healthy body weight or to lose weight. Ask what an ideal weight is for you.  Get at least 30 minutes of exercise that causes your heart to beat faster (aerobic exercise) most days of the week. Activities may include walking, swimming, or biking.  Work with your health care provider or diet and nutrition specialist (dietitian) to adjust your eating plan to your individual calorie needs. Reading food labels  Check food labels for the amount of sodium per serving. Choose foods with less than 5 percent of the Daily Value of sodium. Generally, foods with less than 300 mg of sodium per serving fit into this eating plan.  To find whole grains, look for the word "whole" as the first word in the ingredient list. Shopping  Buy products labeled as  "low-sodium" or "no salt added."  Buy fresh foods. Avoid canned foods and premade or frozen meals. Cooking  Avoid adding salt when cooking. Use salt-free seasonings or herbs instead of table salt or sea salt. Check with your health care provider or pharmacist before using salt substitutes.  Do not fry foods. Cook foods using healthy methods such as baking, boiling, grilling, and broiling instead.  Cook with heart-healthy oils, such as olive, canola, soybean, or sunflower oil. Meal planning   Eat a balanced diet that includes: ? 5 or more servings of fruits and vegetables each day. At each meal, try to fill half of your plate with fruits and vegetables. ? Up to 6-8 servings of whole grains each day. ? Less than 6 oz of lean meat, poultry, or fish each day. A 3-oz serving of meat is about the same size as a deck of cards. One egg equals 1 oz. ? 2 servings of low-fat dairy each day. ? A serving of nuts, seeds, or beans 5 times each week. ? Heart-healthy fats. Healthy fats called Omega-3 fatty acids are found in foods such as flaxseeds and coldwater fish, like sardines, salmon, and mackerel.  Limit how much you eat of the following: ? Canned or prepackaged foods. ? Food that is high in trans fat, such as fried foods. ? Food that is high in saturated fat, such as fatty meat. ? Sweets, desserts, sugary drinks, and other foods with added sugar. ? Full-fat dairy  products.  Do not salt foods before eating.  Try to eat at least 2 vegetarian meals each week.  Eat more home-cooked food and less restaurant, buffet, and fast food.  When eating at a restaurant, ask that your food be prepared with less salt or no salt, if possible. What foods are recommended? The items listed may not be a complete list. Talk with your dietitian about what dietary choices are best for you. Grains Whole-grain or whole-wheat bread. Whole-grain or whole-wheat pasta. Brown rice. Orpah Cobb. Bulgur. Whole-grain  and low-sodium cereals. Pita bread. Low-fat, low-sodium crackers. Whole-wheat flour tortillas. Vegetables Fresh or frozen vegetables (raw, steamed, roasted, or grilled). Low-sodium or reduced-sodium tomato and vegetable juice. Low-sodium or reduced-sodium tomato sauce and tomato paste. Low-sodium or reduced-sodium canned vegetables. Fruits All fresh, dried, or frozen fruit. Canned fruit in natural juice (without added sugar). Meat and other protein foods Skinless chicken or [place]. Ground chicken or [place]. Pork with fat trimmed off. Fish and seafood. Egg whites. Dried beans, peas, or lentils. Unsalted nuts, nut butters, and seeds. Unsalted canned beans. Lean cuts of beef with fat trimmed off. Low-sodium, lean deli meat. Dairy Low-fat (1%) or fat-free (skim) milk. Fat-free, low-fat, or reduced-fat cheeses. Nonfat, low-sodium ricotta or cottage cheese. Low-fat or nonfat yogurt. Low-fat, low-sodium cheese. Fats and oils Soft margarine without trans fats. Vegetable oil. Low-fat, reduced-fat, or light mayonnaise and salad dressings (reduced-sodium). Canola, safflower, olive, soybean, and sunflower oils. Avocado. Seasoning and other foods Herbs. Spices. Seasoning mixes without salt. Unsalted popcorn and pretzels. Fat-free sweets. What foods are not recommended? The items listed may not be a complete list. Talk with your dietitian about what dietary choices are best for you. Grains Baked goods made with fat, such as croissants, muffins, or some breads. Dry pasta or rice meal packs. Vegetables Creamed or fried vegetables. Vegetables in a cheese sauce. Regular canned vegetables (not low-sodium or reduced-sodium). Regular canned tomato sauce and paste (not low-sodium or reduced-sodium). Regular tomato and vegetable juice (not low-sodium or reduced-sodium). Rosita Fire. Olives. Fruits Canned fruit in a light or heavy syrup. Fried fruit. Fruit in cream or butter sauce. Meat and other protein foods Fatty cuts  of meat. Ribs. Fried meat. Tomasa Blase. Sausage. Bologna and other processed lunch meats. Salami. Fatback. Hotdogs. Bratwurst. Salted nuts and seeds. Canned beans with added salt. Canned or smoked fish. Whole eggs or egg yolks. Chicken or [place] with skin. Dairy Whole or 2% milk, cream, and half-and-half. Whole or full-fat cream cheese. Whole-fat or sweetened yogurt. Full-fat cheese. Nondairy creamers. Whipped toppings. Processed cheese and cheese spreads. Fats and oils Butter. Stick margarine. Lard. Shortening. Ghee. Bacon fat. Tropical oils, such as coconut, palm kernel, or palm oil. Seasoning and other foods Salted popcorn and pretzels. Onion salt, garlic salt, seasoned salt, table salt, and sea salt. Worcestershire sauce. Tartar sauce. Barbecue sauce. Teriyaki sauce. Soy sauce, including reduced-sodium. Steak sauce. Canned and packaged gravies. Fish sauce. Oyster sauce. Cocktail sauce. Horseradish that you find on the shelf. Ketchup. Mustard. Meat flavorings and tenderizers. Bouillon cubes. Hot sauce and Tabasco sauce. Premade or packaged marinades. Premade or packaged taco seasonings. Relishes. Regular salad dressings. Where to find more information:  National Heart, Lung, and Blood Institute: PopSteam.is  American Heart Association: www.heart.org Summary  The DASH eating plan is a healthy eating plan that has been shown to reduce high blood pressure (hypertension). It may also reduce your risk for type 2 diabetes, heart disease, and stroke.  With the DASH eating plan, you should limit  salt (sodium) intake to 2,300 mg a day. If you have hypertension, you may need to reduce your sodium intake to 1,500 mg a day.  When on the DASH eating plan, aim to eat more fresh fruits and vegetables, whole grains, lean proteins, low-fat dairy, and heart-healthy fats.  Work with your health care provider or diet and nutrition specialist (dietitian) to adjust your eating plan to your individual calorie  needs. This information is not intended to replace advice given to you by your health care provider. Make sure you discuss any questions you have with your health care provider. Document Released: 01/19/2011 Document Revised: 01/24/2016 Document Reviewed: 01/24/2016 Elsevier Interactive Patient Education  2018 [employer].  Health Maintenance, Male A healthy lifestyle and preventive care is important for your health and wellness. Ask your health care provider about what schedule of regular examinations is right for you. What should I know about weight and diet? Eat a Healthy Diet  Eat plenty of vegetables, fruits, whole grains, low-fat dairy products, and lean protein.  Do not eat a lot of foods high in solid fats, added sugars, or salt.  Maintain a Healthy Weight Regular exercise can help you achieve or maintain a healthy weight. You should:  Do at least 150 minutes of exercise each week. The exercise should increase your heart rate and make you sweat (moderate-intensity exercise).  Do strength-training exercises at least twice a week.  Watch Your Levels of Cholesterol and Blood Lipids  Have your blood tested for lipids and cholesterol every 5 years starting at 34 years of age. If you are at high risk for heart disease, you should start having your blood tested when you are 34 years old. You may need to have your cholesterol levels checked more often if: ? Your lipid or cholesterol levels are high. ? You are older than 34 years of age. ? You are at high risk for heart disease.  What should I know about cancer screening? Many types of cancers can be detected early and may often be prevented. Lung Cancer  You should be screened every year for lung cancer if: ? You are a current smoker who has smoked for at least 30 years. ? You are a former smoker who has quit within the past 15 years.  Talk to your health care provider about your screening options, when you should start  screening, and how often you should be screened.  Colorectal Cancer  Routine colorectal cancer screening usually begins at 34 years of age and should be repeated every 5-10 years until you are 34 years old. You may need to be screened more often if early forms of precancerous polyps or small growths are found. Your health care provider may recommend screening at an earlier age if you have risk factors for colon cancer.  Your health care provider may recommend using home test kits to check for hidden blood in the stool.  A small camera at the end of a tube can be used to examine your colon (sigmoidoscopy or colonoscopy). This checks for the earliest forms of colorectal cancer.  Prostate and Testicular Cancer  Depending on your age and overall health, your health care provider may do certain tests to screen for prostate and testicular cancer.  Talk to your health care provider about any symptoms or concerns you have about testicular or prostate cancer.  Skin Cancer  Check your skin from head to toe regularly.  Tell your health care provider about any new moles or  changes in moles, especially if: ? There is a change in a mole's size, shape, or color. ? You have a mole that is larger than a pencil eraser.  Always use sunscreen. Apply sunscreen liberally and repeat throughout the day.  Protect yourself by wearing long sleeves, pants, a wide-brimmed hat, and sunglasses when outside.  What should I know about heart disease, diabetes, and high blood pressure?  If you are 24-52 years of age, have your blood pressure checked every 3-5 years. If you are 71 years of age or older, have your blood pressure checked every year. You should have your blood pressure measured twice-once when you are at a hospital or clinic, and once when you are not at a hospital or clinic. Record the average of the two measurements. To check your blood pressure when you are not at a hospital or clinic, you can use: ? An  automated blood pressure machine at a pharmacy. ? A home blood pressure monitor.  Talk to your health care provider about your target blood pressure.  If you are between 29-72 years old, ask your health care provider if you should take aspirin to prevent heart disease.  Have regular diabetes screenings by checking your fasting blood sugar level. ? If you are at a normal weight and have a low risk for diabetes, have this test once every three years after the age of 62. ? If you are overweight and have a high risk for diabetes, consider being tested at a younger age or more often.  A one-time screening for abdominal aortic aneurysm (AAA) by ultrasound is recommended for men aged 65-75 years who are current or former smokers. What should I know about preventing infection? Hepatitis B If you have a higher risk for hepatitis B, you should be screened for this virus. Talk with your health care provider to find out if you are at risk for hepatitis B infection. Hepatitis C Blood testing is recommended for:  Everyone born from [birth year] through 1967.  Anyone with known risk factors for hepatitis C.  Sexually Transmitted Diseases (STDs)  You should be screened each year for STDs including gonorrhea and chlamydia if: ? You are sexually active and are younger than 34 years of age. ? You are older than 34 years of age and your health care provider tells you that you are at risk for this type of infection. ? Your sexual activity has changed since you were last screened and you are at an increased risk for chlamydia or gonorrhea. Ask your health care provider if you are at risk.  Talk with your health care provider about whether you are at high risk of being infected with HIV. Your health care provider may recommend a prescription medicine to help prevent HIV infection.  What else can I do?  Schedule regular health, dental, and eye exams.  Stay current with your vaccines (immunizations).  Do not use  any tobacco products, such as cigarettes, chewing tobacco, and e-cigarettes. If you need help quitting, ask your health care provider.  Limit alcohol intake to no more than 2 drinks per day. One drink equals 12 ounces of beer, 5 ounces of wine, or 1 ounces of hard liquor.  Do not use street drugs.  Do not share needles.  Ask your health care provider for help if you need support or information about quitting drugs.  Tell your health care provider if you often feel depressed.  Tell your health care provider if you have ever  been abused or do not feel safe at home. This information is not intended to replace advice given to you by your health care provider. Make sure you discuss any questions you have with your health care provider. Document Released: 07/29/2007 Document Revised: 09/29/2015 Document Reviewed: 11/03/2014 Elsevier Interactive Patient Education  [employer].

## 2017-10-25 NOTE — Assessment & Plan Note (Signed)
Mild off meds. 

## 2017-10-25 NOTE — Assessment & Plan Note (Signed)
Elevated readings today. Discussed dietary approaches to stop hypertension eating plan, handout provided. Advised he monitor bp at home or local pharmacy and to let me know if consistently >140/90.

## 2017-10-25 NOTE — Assessment & Plan Note (Signed)
Encouraged healthy diet and lifestyle to affect sustainable weight loss. He is planning on increasing activity, considering keto diet with wife

## 2017-10-25 NOTE — Progress Notes (Signed)
BP 138/90 (BP Location: Right Arm, Patient Position: Sitting, Cuff Size: Large)   Pulse 99   Temp 98.2 F (36.8 C) (Oral)   Ht 6' 0.5" (1.842 m)   Wt 238 lb 12 oz (108.3 kg)   SpO2 97%   BMI 31.94 kg/m   On repeat, 144/96  CC: CPE Subjective:    Patient ID: Connor Herrera, male    DOB: 06-16-1985, 32 y.o.   MRN: [id number]  HPI: Connor Herrera is a 32 y.o. male presenting on 10/25/2017 for Annual Exam   L plantar wart s/p treatment 08/2017. Wart has improved, black specs remain on skin - using salicylic acid with benefit.   Preventative: Flu shot today Tdap 2012 Seat belt use discussed Sunscreen use discussed. No changing moles on skin. Non smoker Alcohol - none Dentist yearly Eye exam yearly  Caffeine: diet cheer wine 1 bottle/day Lives with wife Leotis Shames), 2 children, 2 cats, 3 dogs Occupation: works at [employer] Activity: chasing kids, hunts, no regular exercise Diet: fruits, vegetables daily, lots of water, small portion sizes   Relevant past medical, surgical, family and social history reviewed and updated as indicated. Interim medical history since our last visit reviewed. Allergies and medications reviewed and updated. No outpatient medications prior to visit.   No facility-administered medications prior to visit.      Per HPI unless specifically indicated in ROS section below Review of Systems  Constitutional: Negative for activity change, appetite change, chills, fatigue, fever and unexpected weight change.  HENT: Positive for congestion. Negative for hearing loss.   Eyes: Negative for visual disturbance.  Respiratory: Positive for cough (of a few days duration). Negative for chest tightness, shortness of breath and wheezing.   Cardiovascular: Negative for chest pain, palpitations and leg swelling.  Gastrointestinal: Negative for abdominal distention, abdominal pain, blood in stool, constipation, diarrhea, nausea and  vomiting.  Genitourinary: Negative for difficulty urinating and hematuria.  Musculoskeletal: Negative for arthralgias, myalgias and neck pain.  Skin: Negative for rash.  Neurological: Negative for dizziness, seizures, syncope and headaches.  Hematological: Negative for adenopathy. Does not bruise/bleed easily.  Psychiatric/Behavioral: Negative for dysphoric mood. The patient is not nervous/anxious.        Objective:    BP 138/90 (BP Location: Right Arm, Patient Position: Sitting, Cuff Size: Large)   Pulse 99   Temp 98.2 F (36.8 C) (Oral)   Ht 6' 0.5" (1.842 m)   Wt 238 lb 12 oz (108.3 kg)   SpO2 97%   BMI 31.94 kg/m   Wt Readings from Last 3 Encounters:  10/25/17 238 lb 12 oz (108.3 kg)  08/20/17 242 lb 8 oz (110 kg)  05/20/15 217 lb 8 oz (98.7 kg)    Physical Exam  Constitutional: He is oriented to person, place, and time. He appears well-developed and well-nourished. No distress.  HENT:  Head: Normocephalic and atraumatic.  Right Ear: Hearing, tympanic membrane, external ear and ear canal normal.  Left Ear: Hearing, tympanic membrane, external ear and ear canal normal.  Nose: Nose normal.  Mouth/Throat: Uvula is midline, oropharynx is clear and moist and mucous membranes are normal. No oropharyngeal exudate, posterior oropharyngeal edema or posterior oropharyngeal erythema.  Eyes: Pupils are equal, round, and reactive to light. Conjunctivae and EOM are normal. No scleral icterus.  Neck: Normal range of motion. Neck supple. No thyromegaly present.  Cardiovascular: Normal rate, regular rhythm, normal heart sounds and intact distal pulses.  No murmur heard. Pulses:  Radial pulses are 2+ on the right side, and 2+ on the left side.  Pulmonary/Chest: Effort normal and breath sounds normal. No respiratory distress. He has no wheezes. He has no rales.  Abdominal: Soft. Bowel sounds are normal. He exhibits no distension and no mass. There is no tenderness. There is no rebound  and no guarding.  Musculoskeletal: Normal range of motion. He exhibits no edema.  Lymphadenopathy:    He has no cervical adenopathy.  Neurological: He is alert and oriented to person, place, and time.  CN grossly intact, station and gait intact  Skin: Skin is warm and dry. No rash noted.  Psychiatric: He has a normal mood and affect. His behavior is normal. Judgment and thought content normal.  Nursing note and vitals reviewed.  Results for orders placed or performed in visit on 10/22/17  Hemoglobin A1c  Result Value Ref Range   Hgb A1c MFr Bld 5.5 4.6 - 6.5 %  Comprehensive metabolic panel  Result Value Ref Range   Sodium 137 135 - 145 mEq/L   Potassium 4.6 3.5 - 5.1 mEq/L   Chloride 101 96 - 112 mEq/L   CO2 29 19 - 32 mEq/L   Glucose, Bld 99 70 - 99 mg/dL   BUN 15 6 - 23 mg/dL   Creatinine, Ser 9.601.31 0.40 - 1.50 mg/dL   Total Bilirubin 0.6 0.2 - 1.2 mg/dL   Alkaline Phosphatase 67 39 - 117 U/L   AST 26 0 - 37 U/L   ALT 38 0 - 53 U/L   Total Protein 7.1 6.0 - 8.3 g/dL   Albumin 4.4 3.5 - 5.2 g/dL   Calcium 9.3 8.4 - 45.410.5 mg/dL   GFR 09.8166.46 >19.14>60.00 mL/min  Lipid panel  Result Value Ref Range   Cholesterol 195 0 - 200 mg/dL   Triglycerides 782.9144.0 0.0 - 149.0 mg/dL   HDL 56.2153.60 >30.86>39.00 mg/dL   VLDL 57.828.8 0.0 - 46.940.0 mg/dL   LDL Cholesterol 629112 (H) 0 - 99 mg/dL   Total CHOL/HDL Ratio 4    NonHDL 141.11       Assessment & Plan:  He will drop off form if any needed for insurance purposes.  Problem List Items Addressed This Visit    Obesity, Class I, BMI 30.0-34.9 (see actual BMI)    Encouraged healthy diet and lifestyle to affect sustainable weight loss. He is planning on increasing activity, considering keto diet with wife      Hyperlipidemia    Mild off meds.       Healthcare maintenance - Primary    Preventative protocols reviewed and updated unless pt declined. Discussed healthy diet and lifestyle.       Elevated blood pressure reading in office without diagnosis of  hypertension    Elevated readings today. Discussed dietary approaches to stop hypertension eating plan, handout provided. Advised he monitor bp at home or local pharmacy and to let me know if consistently >140/90.       Other Visit Diagnoses    Need for influenza vaccination       Relevant Orders   Flu Vaccine QUAD 36+ mos IM (Completed)       No orders of the defined types were placed in this encounter.  Orders Placed This Encounter  Procedures  . Flu Vaccine QUAD 36+ mos IM    Follow up plan: Return in about 1 year (around 10/26/2018) for annual exam, prior fasting for blood work.  Eustaquio BoydenJavier Maggy Wyble, MD

## 2017-12-24 ENCOUNTER — Encounter (HOSPITAL_COMMUNITY): Payer: Self-pay | Admitting: Emergency Medicine

## 2017-12-24 ENCOUNTER — Other Ambulatory Visit: Payer: Self-pay

## 2017-12-24 DIAGNOSIS — Y929 Unspecified place or not applicable: Secondary | ICD-10-CM | POA: Diagnosis not present

## 2017-12-24 DIAGNOSIS — E785 Hyperlipidemia, unspecified: Secondary | ICD-10-CM | POA: Insufficient documentation

## 2017-12-24 DIAGNOSIS — Y939 Activity, unspecified: Secondary | ICD-10-CM | POA: Diagnosis not present

## 2017-12-24 DIAGNOSIS — W458XXA Other foreign body or object entering through skin, initial encounter: Secondary | ICD-10-CM | POA: Diagnosis not present

## 2017-12-24 DIAGNOSIS — Z23 Encounter for immunization: Secondary | ICD-10-CM | POA: Diagnosis not present

## 2017-12-24 DIAGNOSIS — Y998 Other external cause status: Secondary | ICD-10-CM | POA: Diagnosis not present

## 2017-12-24 DIAGNOSIS — S61412A Laceration without foreign body of left hand, initial encounter: Secondary | ICD-10-CM | POA: Diagnosis not present

## 2017-12-24 NOTE — ED Triage Notes (Signed)
Pt presents with a laceration to the crease of the left thumb between the thumb and hand. No bleeding noted. Patient states he cut it on a metal broadhead. Patient neuro in tact, able to move thumb. Patient unsure when last tetanus shot was.

## 2017-12-25 ENCOUNTER — Emergency Department (HOSPITAL_COMMUNITY)
Admission: EM | Admit: 2017-12-25 | Discharge: 2017-12-25 | Disposition: A | Discharge status: Home / Self Care | Payer: 59 | Attending: Emergency Medicine | Admitting: Emergency Medicine

## 2017-12-25 DIAGNOSIS — S61412A Laceration without foreign body of left hand, initial encounter: Secondary | ICD-10-CM

## 2017-12-25 MED ORDER — LIDOCAINE HCL 2 % IJ SOLN
20.0000 mL | Freq: Once | INTRAMUSCULAR | Status: AC
  Administered 2017-12-25: 400 mg
  Filled 2017-12-25: qty 20

## 2017-12-25 MED ORDER — TETANUS-DIPHTH-ACELL PERTUSSIS 5-2.5-18.5 LF-MCG/0.5 IM SUSP
0.5000 mL | Freq: Once | INTRAMUSCULAR | Status: AC
  Administered 2017-12-25: 0.5 mL via INTRAMUSCULAR
  Filled 2017-12-25: qty 0.5

## 2017-12-25 NOTE — Discharge Instructions (Addendum)
Sutures out in 14 days.  Keep the area clean and dry.  Return for any signs of infection.

## 2017-12-25 NOTE — ED Provider Notes (Signed)
[facility]-EMERGENCY DEPT Provider Note   CSN: [id number] Arrival date & time: 12/24/17  2015     History   Chief Complaint Chief Complaint  Patient presents with  . Extremity Laceration    Left    HPI Connor Herrera is a 32 y.o. male.  HPI Patient presents to the emergency department with a laceration to the left hand.  The patient lacerated the area just below the base of the thumb on the palmar aspect of the hand.  The patient states he applied cayenne pepper to stop the bleeding.  Patient states he cut it on a piece of metal area the patient states that nothing seems to make the condition better or worse.  Patient denies nausea or vomiting or syncope. Past Medical History:  Diagnosis Date  . History of chicken pox   . Significant use of alcohol 02/2013   LOC eval at ER    Patient Active Problem List   Diagnosis Date Noted  . Obesity, Class I, BMI 30.0-34.9 (see actual BMI) 10/25/2017  . Elevated blood pressure reading in office without diagnosis of hypertension 10/25/2017  . Plantar wart of left foot 08/20/2017  . Hyperglycemia 05/20/2015  . Hyperlipidemia 05/20/2015  . Tinnitus, left 10/18/2010  . Healthcare maintenance 10/12/2010    Past Surgical History:  Procedure Laterality Date  . INNER EAR SURGERY  2001   left traumatic ruptured TM, lacross ball, residual tinnitus  . WRIST SURGERY  2002   left fracture skateboarding        Home Medications    Prior to Admission medications   Not on File    Family History Family History  Problem Relation Age of Onset  . Diabetes Father   . Arthritis Paternal Grandmother   . Diabetes Paternal Grandmother   . Cancer Paternal Grandmother        breast  . Kidney disease Paternal Grandfather   . Coronary artery disease Paternal Grandfather [id number]  . Stroke Neg Hx     Social History Social History   Tobacco Use  . Smoking status: Never Smoker  . Smokeless tobacco: Never Used    Substance Use Topics  . Alcohol use: Not Currently    Comment: 6 drinks/weekend  . Drug use: No     Allergies   Patient has no known allergies.   Review of Systems Review of Systems  All other systems negative except as documented in the HPI. All pertinent positives and negatives as reviewed in the HPI. Physical Exam Updated Vital Signs BP (!) 142/90   Pulse 94   Temp 98.1 F (36.7 C)   SpO2 91%   Physical Exam  Constitutional: He is oriented to person, place, and time. He appears well-developed and well-nourished. No distress.  HENT:  Head: Normocephalic and atraumatic.  Eyes: Pupils are equal, round, and reactive to light.  Pulmonary/Chest: Effort normal.  Musculoskeletal:       Hands: Neurological: He is alert and oriented to person, place, and time.  Skin: Skin is warm and dry.  Psychiatric: He has a normal mood and affect.  Nursing note and vitals reviewed.    ED Treatments / Results  Labs (all labs ordered are listed, but only abnormal results are displayed) Labs Reviewed - No data to display  EKG None  Radiology No results found.  Procedures Procedures (including critical care time)  Medications Ordered in ED Medications  Tdap (BOOSTRIX) injection 0.5 mL (has no administration in time range)  lidocaine (  XYLOCAINE) 2 % (with pres) injection 400 mg (400 mg Infiltration Given by Other 12/25/17 0101)     Initial Impression / Assessment and Plan / ED Course  I have reviewed the triage vital signs and the nursing notes.  Pertinent labs & imaging results that were available during my care of the patient were reviewed by me and considered in my medical decision making (see chart for details).     LACERATION REPAIR Performed by: Carlyle Dolly Authorized by: Jamesetta Orleans Kamdon Reisig Consent: Verbal consent obtained. Risks and benefits: risks, benefits and alternatives were discussed Consent given by: patient Patient identity confirmed:  provided demographic data Prepped and Draped in normal sterile fashion Wound explored  Laceration Location: Left hand  Laceration Length: 3 cm  No Foreign Bodies seen or palpated  Anesthesia: local infiltration  Local anesthetic: lidocaine 2 % without epinephrine  Anesthetic total: 6 ml  Irrigation method: syringe Amount of cleaning: standard  Skin closure: 4-0 Prolene  Number of sutures: 8  Technique: Simple interrupted  Patient tolerance: Patient tolerated the procedure well with no immediate complications.  She is advised to keep the sutures in place for the next 2 weeks due to the fact that in a very movable high tension area.  Patient is advised to keep the area clean and dry.  Told to return here for any signs of infection or worsening in his condition. Final Clinical Impressions(s) / ED Diagnoses   Final diagnoses:  None    ED Discharge Orders    None       Efren, Kross, PA-C 12/25/17 0202    Molpus, Jonny Ruiz, MD 12/25/17 [phone]

## 2018-10-14 ENCOUNTER — Other Ambulatory Visit: Payer: Self-pay

## 2018-10-14 ENCOUNTER — Ambulatory Visit (INDEPENDENT_AMBULATORY_CARE_PROVIDER_SITE_OTHER): Payer: 59 | Admitting: Internal Medicine

## 2018-10-14 ENCOUNTER — Encounter: Payer: Self-pay | Admitting: Internal Medicine

## 2018-10-14 ENCOUNTER — Telehealth: Payer: Self-pay

## 2018-10-14 ENCOUNTER — Ambulatory Visit: Payer: 59 | Admitting: Internal Medicine

## 2018-10-14 VITALS — BP 150/102 | HR 77 | Temp 98.8°F | Ht 72.5 in | Wt 242.0 lb

## 2018-10-14 DIAGNOSIS — R002 Palpitations: Secondary | ICD-10-CM | POA: Insufficient documentation

## 2018-10-14 NOTE — Assessment & Plan Note (Signed)
2 separate sensations that both sound like it could be PACs/PVCs No tachycardia evident No other symptoms No clear reason--except the kids starting school and more stress on him  EKG shows sinus, normal axis and intervals. Possible LAE, but no ischemic changes. Since 03/01/13, the axis is normal, and inferior T changes have also normalized  Will check labs  Consider cardiology evaluation if worsens

## 2018-10-14 NOTE — Progress Notes (Signed)
Subjective:    Patient ID: Connor Herrera, male    DOB: April 15, 1985, 33 y.o.   MRN: [id number]  HPI Here due to heart sensations  Has had a "hard thump" that sort of takes his breath away Once a day or so--for the past 2 weeks (but hasn't felt it in a few days) The other sensation is a "flutter" sensation. 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 fluticasone (FLONASE) 50 MCG/ACT nasal spray Place into both nostrils daily.     No current facility-administered medications on file prior to visit.     No Known Allergies  Past Medical History:  Diagnosis Date  . History of chicken pox   . Significant use of alcohol 02/2013   LOC eval at ER    Past Surgical History:  Procedure Laterality Date  . INNER EAR SURGERY  2001   left traumatic ruptured TM, lacross ball, residual tinnitus  . WRIST SURGERY  2002   left fracture skateboarding    Family History  Problem Relation Age of Onset  . Diabetes Father   . Arthritis Paternal Grandmother   . Diabetes Paternal Grandmother   . Cancer Paternal Grandmother        breast  . Kidney disease Paternal Grandfather   . Coronary artery disease Paternal Grandfather [phone]  . Stroke Neg Hx     Social History   Socioeconomic History  . Marital status: Married    Spouse name: Not on file  . Number of children: 2  . Years of education: comm colle  . Highest education level: Not on file  Occupational History  . 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 Financial resource strain: Not  on file  . Food insecurity    Worry: Not on file    Inability: Not on file  . Transportation needs    Medical: Not on file    Non-medical: Not on file  Tobacco Use  . Smoking status: Never Smoker  . Smokeless tobacco: Never Used  Substance and Sexual Activity  . Alcohol use: Not Currently    Comment: 6 drinks/weekend  . Drug use: No  . Sexual activity: Not on file  Lifestyle  . Physical activity    Days per week: Not on file    Minutes per session: Not on file  . Stress: Not on file  Relationships  . Social Musicianconnections    Talks on phone: Not on file    Gets together: Not on file    Attends religious service: Not on file    Active member of club or organization: Not on file    Attends meetings of clubs or organizations: Not on file    Relationship status: Not on file  . Intimate partner violence    Fear of current or ex partner: Not on file    Emotionally abused: Not on file    Physically abused: Not on file    Forced sexual activity: Not on file  Other Topics Concern  .  Not on file  Social History Narrative   Caffeine: diet cheer wine   Lives with wife Ander Purpura), 2 children, 2 cats, 3 dogs   Occupation: works at [employer] lead   Activity: chasing kids, hunts, no regular exercise   Diet: fruits, vegetables daily, lots of water, small portion sizes    Review of Systems Not sick No fever No chest pain No decongestants Not having caffeine of late--not even daily now Weight is up a bit No heartburn Very warm at night--some sweats    Objective:   Physical Exam         Assessment & Plan:

## 2018-10-14 NOTE — Telephone Encounter (Signed)
Will assess him at OV and check EKG

## 2018-10-14 NOTE — Telephone Encounter (Signed)
Pt said for 1 wk maybe 1 - 2 times a day pt has mid chest feeling that heart has a thumping sensation; pt said hard to describe but feels like irregular heart beat and takes breath for 1 second. No CP or radiation of pain to neck, shoulder or arm. No weakness and no vision changes. Last time happened was 9 AM- 9:30 AM this morning. 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 Pt has night sweats for 10 + yrs. Pt would like to be seen today. Pt has no covid symptoms, no travel and no known exposure to + covid. Pt scheduled in office appt with Dr Silvio Pate today at 2:45.  ED precautions given. FYI to Dr Silvio Pate.

## 2018-10-15 LAB — COMPREHENSIVE METABOLIC PANEL
ALT: 41 U/L (ref 0–53)
AST: 26 U/L (ref 0–37)
Albumin: 4.6 g/dL (ref 3.5–5.2)
Alkaline Phosphatase: 68 U/L (ref 39–117)
BUN: 18 mg/dL (ref 6–23)
CO2: 29 mEq/L (ref 19–32)
Calcium: 9.7 mg/dL (ref 8.4–10.5)
Chloride: 99 mEq/L (ref 96–112)
Creatinine, Ser: 1.25 mg/dL (ref 0.40–1.50)
GFR: 65.63 mL/min (ref >60.00)
Glucose, Bld: 108 mg/dL — ABNORMAL HIGH (ref 70–99)
Potassium: 4.2 mEq/L (ref 3.5–5.1)
Sodium: 138 mEq/L (ref 135–145)
Total Bilirubin: 0.5 mg/dL (ref 0.2–1.2)
Total Protein: 7.4 g/dL (ref 6.0–8.3)

## 2018-10-15 LAB — CBC
HCT: 48 % (ref 39.0–52.0)
Hemoglobin: 15.9 g/dL (ref 13.0–17.0)
MCHC: 33.2 g/dL (ref 30.0–36.0)
MCV: 94.6 fl (ref 78.0–100.0)
Platelets: 268 10*3/uL (ref 150.0–400.0)
RBC: 5.07 Mil/uL (ref 4.22–5.81)
RDW: 13.4 % (ref 11.5–15.5)
WBC: 8.2 10*3/uL (ref 4.0–10.5)

## 2018-10-15 LAB — T4, FREE: Free T4: 0.73 ng/dL (ref 0.60–1.60)

## 2019-03-31 ENCOUNTER — Ambulatory Visit: Payer: 59 | Attending: Internal Medicine

## 2019-03-31 DIAGNOSIS — Z20822 Contact with and (suspected) exposure to covid-19: Secondary | ICD-10-CM

## 2019-04-01 LAB — NOVEL CORONAVIRUS, NAA: SARS-CoV-2, NAA: NOT DETECTED

## 2019-05-26 ENCOUNTER — Ambulatory Visit (INDEPENDENT_AMBULATORY_CARE_PROVIDER_SITE_OTHER): Payer: 59 | Admitting: Family Medicine

## 2019-05-26 ENCOUNTER — Encounter: Payer: Self-pay | Admitting: Family Medicine

## 2019-05-26 ENCOUNTER — Other Ambulatory Visit: Payer: Self-pay

## 2019-05-26 VITALS — BP 134/86 | HR 85 | Temp 98.2°F | Ht 72.05 in | Wt 246.2 lb

## 2019-05-26 DIAGNOSIS — R400 Somnolence: Secondary | ICD-10-CM

## 2019-05-26 DIAGNOSIS — E669 Obesity, unspecified: Secondary | ICD-10-CM | POA: Diagnosis not present

## 2019-05-26 DIAGNOSIS — R0981 Nasal congestion: Secondary | ICD-10-CM | POA: Diagnosis not present

## 2019-05-26 DIAGNOSIS — G4733 Obstructive sleep apnea (adult) (pediatric): Secondary | ICD-10-CM | POA: Insufficient documentation

## 2019-05-26 NOTE — Assessment & Plan Note (Signed)
Discussed weight gain noted. He asks about slowing metabolism. Will check labwork at next physical.

## 2019-05-26 NOTE — Patient Instructions (Signed)
Story suspicious for sleep apnea. We will refer you to pulmonology for further evaluation.

## 2019-05-26 NOTE — Assessment & Plan Note (Addendum)
Notes more noticeable sensation of nasal obstruction over the last few months, with evidence of narrow L nasal passage vs L nasal deviation. Discussed INS use, would consider ENT eval if worsening.

## 2019-05-26 NOTE — Assessment & Plan Note (Signed)
Story suspicious for OSA, ESS = 8.  Discussed pathophysiology of OSA as well as benefits of treatment.  Refer to pulm for further evaluation.  Pt agrees with plan.

## 2019-05-26 NOTE — Progress Notes (Signed)
This visit was conducted in person.  BP 134/86 (BP Location: Left Arm, Patient Position: Sitting, Cuff Size: Large)   Pulse 85   Temp 98.2 F (36.8 C) (Temporal)   Ht 6' 0.05" (1.83 m)   Wt 246 lb 4 oz (111.7 kg)   SpO2 96%   BMI 33.35 kg/m   BP Readings from Last 3 Encounters:  05/26/19 134/86  10/14/18 (!) 150/102  12/25/17 (!) 129/91    CC: ?sleep apnea Subjective:    Patient ID: Connor Herrera, male    DOB: Apr 18, 1985, 34 y.o.   MRN: [id number]  HPI: Connor Herrera is a 34 y.o. male presenting on 05/26/2019 for Sleep Apnea (Per pt's spouse, pt snores heavily and stops breathing during sleep.  Worse when lying on back.  Brought to his attention about 2 mos ago. )   Wife has witnessed apneic episodes, snoring, gasping for breath. Ongoing for the last several months. He doesn't notice any of this. He has noted dry mouth when he awakens for the past month. Overall feels restorative sleep but daytime sleepiness easily develops.    Over the years has tried different diets - including keto diet, intermittent fasting, currently limiting sugars/carbs. Ongoing weight gain noted.      Relevant past medical, surgical, family and social history reviewed and updated as indicated. Interim medical history since our last visit reviewed. Allergies and medications reviewed and updated. Outpatient Medications Prior to Visit  Medication Sig Dispense Refill  . fluticasone (FLONASE) 50 MCG/ACT nasal spray Place into both nostrils daily. As needed     No facility-administered medications prior to visit.     Per HPI unless specifically indicated in ROS section below Review of Systems Objective:    BP 134/86 (BP Location: Left Arm, Patient Position: Sitting, Cuff Size: Large)   Pulse 85   Temp 98.2 F (36.8 C) (Temporal)   Ht 6' 0.05" (1.83 m)   Wt 246 lb 4 oz (111.7 kg)   SpO2 96%   BMI 33.35 kg/m   Wt Readings from Last 3 Encounters:  05/26/19 246 lb 4 oz (111.7 kg)   10/14/18 242 lb (109.8 kg)  10/25/17 238 lb 12 oz (108.3 kg)    Physical Exam Vitals and nursing note reviewed.  Constitutional:      Appearance: Normal appearance. He is not ill-appearing.  HENT:     Nose: Septal deviation (to left) and mucosal edema (nasal mucosal erythema) present. No congestion or rhinorrhea.     Mouth/Throat:     Mouth: Mucous membranes are moist.     Tongue: No lesions.     Palate: No mass.     Pharynx: Oropharynx is clear. No oropharyngeal exudate or posterior oropharyngeal erythema.     Comments: Crowded oropharynx Eyes:     Extraocular Movements: Extraocular movements intact.     Pupils: Pupils are equal, round, and reactive to light.  Neck:     Thyroid: No thyromegaly or thyroid tenderness.  Cardiovascular:     Rate and Rhythm: Normal rate and regular rhythm.     Pulses: Normal pulses.     Heart sounds: Normal heart sounds. No murmur.  Pulmonary:     Effort: Pulmonary effort is normal. No respiratory distress.     Breath sounds: Normal breath sounds. No wheezing, rhonchi or rales.  Musculoskeletal:     Cervical back: Normal range of motion and neck supple.     Right lower leg: No edema.     Left  lower leg: No edema.  Neurological:     Mental Status: He is alert.  Psychiatric:        Mood and Affect: Mood normal.        Behavior: Behavior normal.       Assessment & Plan:  This visit occurred during the SARS-CoV-2 public health emergency.  Safety protocols were in place, including screening questions prior to the visit, additional usage of staff PPE, and extensive cleaning of exam room while observing appropriate contact time as indicated for disinfecting solutions.   Problem List Items Addressed This Visit    Obesity, Class I, BMI 30.0-34.9 (see actual BMI)    Discussed weight gain noted. He asks about slowing metabolism. Will check labwork at next physical.       Nasal congestion    Notes more noticeable sensation of nasal obstruction over  the last few months, with evidence of narrow L nasal passage vs L nasal deviation. Discussed INS use, would consider ENT eval if worsening.       Daytime somnolence - Primary    Story suspicious for OSA, ESS = 8.  Discussed pathophysiology of OSA as well as benefits of treatment.  Refer to pulm for further evaluation.  Pt agrees with plan.       Relevant Orders   Ambulatory referral to Pulmonology       No orders of the defined types were placed in this encounter.  Orders Placed This Encounter  Procedures  . Ambulatory referral to Pulmonology    Referral Priority:   Routine    Referral Type:   Consultation    Referral Reason:   Specialty Services Required    Requested Specialty:   Pulmonary Disease    Number of Visits Requested:   1   Patient Instructions  Story suspicious for sleep apnea. We will refer you to pulmonology for further evaluation.    Follow up plan: Return if symptoms worsen or fail to improve.  Ria Bush, MD

## 2019-06-03 ENCOUNTER — Ambulatory Visit (INDEPENDENT_AMBULATORY_CARE_PROVIDER_SITE_OTHER): Payer: 59 | Admitting: Orthopaedic Surgery

## 2019-06-03 ENCOUNTER — Ambulatory Visit: Payer: Self-pay

## 2019-06-03 ENCOUNTER — Other Ambulatory Visit: Payer: Self-pay

## 2019-06-03 ENCOUNTER — Encounter: Payer: Self-pay | Admitting: Orthopaedic Surgery

## 2019-06-03 DIAGNOSIS — M25532 Pain in left wrist: Secondary | ICD-10-CM | POA: Insufficient documentation

## 2019-06-03 NOTE — Progress Notes (Signed)
Office Visit Note   Patient: Connor Herrera           Date of Birth: 1985/07/08           MRN: [id number] Visit Date: 06/03/2019              Requested by: [name], MD [address] [place],  [place] [address] PCP: [name], MD   Assessment & Plan: Visit Diagnoses:  1. Pain in left wrist     Plan: Impression is strain of left forearm APL and EPB tendons.  He has normal motor function with reproducible pain.  He likely still has some swelling and edema in this area.  I recommended regular NSAIDs for couple weeks and Voltaren gel and relative rest.  He is not reporting any symptoms related to the hardware.  Will if he does not notice any improvement he will follow-up with [place].  Follow-Up Instructions: Return if symptoms worsen or fail to improve.   Orders:  Orders Placed This Encounter  Procedures  . XR Wrist Complete Left   No orders of the defined types were placed in this encounter.     Procedures: No procedures performed   Clinical Data: No additional findings.   Subjective: Chief Complaint  Patient presents with  . Left Wrist - Pain    Connor Herrera is a 34 year old gentleman who is a husband of Connor Herrera comes in for evaluation of 67-month history of left wrist pain on the radial volar aspect distal forearm.  Started when his dog jerked the leash and cause discomfort.  He is tender to palpation.  Denies any numbness and tingling.  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  There is some discomfort while lifting with the left hand.  He is status post ORIF left distal radius fracture in 2002.  He denies any grinding or rubbing sensation of the extensor flexor tendons.   Review of Systems   Objective: Vital Signs: There were no vitals taken for this visit.  Physical Exam  Ortho Exam Left wrist shows a fully healed surgical scar.  Negative Finkelstein's.  Normal range of motion without pain.  He is tender  over the radial volar portion of the distal forearm overlying the proximal extent of the APL and EPB tendons.  No soft tissue crepitus. Specialty Comments:  No specialty comments available.  Imaging: XR Wrist Complete Left  Result Date: 06/03/2019 Status post ORIF distal radius fracture without complications.  No degenerative changes.    PMFS History: Patient Active Problem List   Diagnosis Date Noted  . Pain in left wrist 06/03/2019  . Daytime somnolence 05/26/2019  . Nasal congestion 05/26/2019  . Palpitations 10/14/2018  . Obesity, Class I, BMI 30.0-34.9 (see actual BMI) 10/25/2017  . Elevated blood pressure reading in office without diagnosis of hypertension 10/25/2017  . Plantar wart of left foot 08/20/2017  . Hyperglycemia 05/20/2015  . Hyperlipidemia 05/20/2015  . Tinnitus, left 10/18/2010  . Healthcare maintenance 10/12/2010   Past Medical History:  Diagnosis Date  . History of chicken pox   . Significant use of alcohol 02/2013   LOC eval at ER    Family History  Problem Relation Age of Onset  . Diabetes Father   . Arthritis Paternal Grandmother   . Diabetes Paternal Grandmother   . Cancer Paternal Grandmother        breast  . Kidney disease Paternal Grandfather   . Coronary artery disease  Paternal Grandfather 60  . Stroke Neg Hx     Past Surgical History:  Procedure Laterality Date  . INNER EAR SURGERY  2001   left traumatic ruptured TM, lacross ball, residual tinnitus  . WRIST SURGERY  2002   left fracture skateboarding   Social History   Occupational History  . 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 Smoking status: Never Smoker  . Smokeless tobacco: Never Used  Substance and Sexual Activity  . Alcohol use: Not Currently    Comment: 6 drinks/weekend  . Drug use: No  . Sexual activity: Not on file

## 2019-07-08 ENCOUNTER — Encounter: Payer: Self-pay | Admitting: Adult Health

## 2019-07-08 ENCOUNTER — Other Ambulatory Visit: Payer: Self-pay

## 2019-07-08 ENCOUNTER — Ambulatory Visit (INDEPENDENT_AMBULATORY_CARE_PROVIDER_SITE_OTHER): Payer: 59 | Admitting: Adult Health

## 2019-07-08 DIAGNOSIS — E669 Obesity, unspecified: Secondary | ICD-10-CM | POA: Diagnosis not present

## 2019-07-08 DIAGNOSIS — R400 Somnolence: Secondary | ICD-10-CM

## 2019-07-08 NOTE — Progress Notes (Signed)
@Patient  ID: Connor Herrera, male    DOB: [birth year], 34 y.o.   MRN: [id number]  Chief Complaint  Patient presents with  . Consult    Daytime sleepiness    Referring provider: Ria Bush, MD  HPI: 34 year old male never smoker presents Jul 08, 2019 for sleep consult for daytime sleepiness and snoring Medical history significant for elevated blood pressure  TEST/EVENTS :   07/08/2019 Sleep Consult Patient presents for a sleep consult.  Patient was referred by his primary care provider who has been seeing patient for some elevated blood pressures.  Patient also had some complaints of snoring and daytime sleepiness.  Patient says that he goes to sleep each night about 11:00 gets up about 6 in the morning.  Goes asleep within 30 minutes and wakes up only once or twice a night.  He has no caffeine intake during the daytime.  Says his weight is up about 20 pounds over the last couple years.  Says he is very active.  He does have some daytime sleepiness.  He has report of very loud snoring and gasping for air during nighttime sleeping.  It is gotten worse and his wife is unable to sleep in the bed with him due to his loud snoring.  Patient denies any sleep paralysis, narcolepsy symptoms, teeth grinding or teeth clenching.  No TMJ symptoms.  Father has sleep apnea.  With a score is 7  No Known Allergies  Immunization History  Administered Date(s) Administered  . Influenza Split 12/19/2011  . Influenza,inj,Quad PF,6+ Mos 10/25/2017  . Td 02/13/1998  . Tdap 10/14/2010, 12/25/2017    Past Medical History:  Diagnosis Date  . History of chicken pox   . Significant use of alcohol 02/2013   LOC eval at ER   Elevated blood pressure  Tobacco History: Social History   Tobacco Use  Smoking Status Never Smoker  Smokeless Tobacco Never Used   Social history never smoker, previous alcohol use.  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  Family  history significant for heart disease and cancer.  Father has sleep apnea.   Counseling given: Not Answered   Outpatient Medications Prior to Visit  Medication Sig Dispense Refill  . fluticasone (FLONASE) 50 MCG/ACT nasal spray Place into both nostrils daily. As needed     No facility-administered medications prior to visit.     Review of Systems:   Constitutional:   No  weight loss, night sweats,  Fevers, chills, fatigue, or  lassitude.  HEENT:   No headaches,  Difficulty swallowing,  Tooth/dental problems, or  Sore throat,                No sneezing, itching, ear ache, nasal congestion, post nasal drip,   CV:  No chest pain,  Orthopnea, PND, swelling in lower extremities, anasarca, dizziness, palpitations, syncope.   GI  No heartburn, indigestion, abdominal pain, nausea, vomiting, diarrhea, change in bowel habits, loss of appetite, bloody stools.   Resp: No shortness of breath with exertion or at rest.  No excess mucus, no productive cough,  No non-productive cough,  No coughing up of blood.  No change in color of mucus.  No wheezing.  No chest wall deformity  Skin: no rash or lesions.  GU: no dysuria, change in color of urine, no urgency or frequency.  No flank pain, no hematuria   MS:  No joint pain or swelling.  No decreased range of motion.  No  back pain.    Physical Exam  BP 138/86 (BP Location: Left Arm, Cuff Size: Normal)   Pulse 88   Temp 98.1 F (36.7 C) (Oral)   Ht 6\' 2"  (1.88 m)   Wt 241 lb 3.2 oz (109.4 kg)   SpO2 98%   BMI 30.97 kg/m   GEN: A/Ox3; pleasant , NAD, well nourished    HEENT:  [place]/AT,   NOSE-clear, THROAT-clear, no lesions, no postnasal drip or exudate noted.  Class II-III MP airway  NECK:  Supple w/ fair ROM; no JVD; normal carotid impulses w/o bruits; no thyromegaly or nodules palpated; no lymphadenopathy.    RESP  Clear  P & A; w/o, wheezes/ rales/ or rhonchi. no accessory muscle use, no dullness to percussion  CARD:  RRR, no m/r/g,  no peripheral edema, pulses intact, no cyanosis or clubbing.  GI:   Soft & nt; nml bowel sounds; no organomegaly or masses detected.   Musco: Warm bil, no deformities or joint swelling noted.   Neuro: alert, no focal deficits noted.    Skin: Warm, no lesions or rashes    Lab Results:  CBC  BNP No results found for: BNP  ProBNP No results found for: PROBNP  Imaging: No results found.    No flowsheet data found.  No results found for: NITRICOXIDE      Assessment & Plan:   Daytime somnolence Mr. is a very nice gentleman.  Appreciate sleep referral.  He has Daytime sleepiness, snoring, weight gain, witnessed apneic events-high risk for underlying sleep apnea.  We will set patient up for a home sleep study. Patient education on obstructive sleep apnea  Plan    Patient Instructions  Set up home sleep study .  Work on healthy weight .  Do not drive if sleepy .  Healthy sleep regimen  Follow up in 3 months with Dr. Rosalyn Gess or APP and As needed         Obesity, Class I, BMI 30.0-34.9 (see actual BMI) Patient education on healthy weight loss     06-17-1992, NP 07/08/2019

## 2019-07-08 NOTE — Assessment & Plan Note (Signed)
Mr. Rosalyn Gess is a very nice gentleman.  Appreciate sleep referral.  He has Daytime sleepiness, snoring, weight gain, witnessed apneic events-high risk for underlying sleep apnea.  We will set patient up for a home sleep study. Patient education on obstructive sleep apnea  Plan    Patient Instructions  Set up home sleep study .  Work on healthy weight .  Do not drive if sleepy .  Healthy sleep regimen  Follow up in 3 months with Dr. Belia Heman or APP and As needed

## 2019-07-08 NOTE — Progress Notes (Signed)
Reviewed and agree with assessment/plan.   Coralyn Helling, MD [facility] Pulmonary/Critical Care 07/08/2019, 5:38 PM Pager:  [phone]

## 2019-07-08 NOTE — Assessment & Plan Note (Signed)
Patient education on healthy weight loss

## 2019-07-08 NOTE — Patient Instructions (Addendum)
Set up home sleep study .  Work on healthy weight .  Do not drive if sleepy .  Healthy sleep regimen  Follow up in 3 months with Dr. Belia Heman or APP and As needed

## 2019-08-01 ENCOUNTER — Telehealth: Payer: Self-pay | Admitting: Adult Health

## 2019-08-01 DIAGNOSIS — R400 Somnolence: Secondary | ICD-10-CM

## 2019-08-01 NOTE — Telephone Encounter (Signed)
Pt is aware that Bjorn Loser is out of the office today and will be back on Monday.

## 2019-08-04 NOTE — Telephone Encounter (Signed)
There is not an order in for a HST.  Please place order. Rhonda J Cobb

## 2019-08-04 NOTE — Telephone Encounter (Signed)
HST has been ordered

## 2019-08-04 NOTE — Telephone Encounter (Signed)
Rhonda please advise 

## 2019-08-05 NOTE — Telephone Encounter (Signed)
Called to schedule HST. No answer. LMOVM for pt to return my call to schedule HST at the [facility]. Rhonda J Cobb

## 2019-08-06 NOTE — Telephone Encounter (Signed)
Pt returned call and scheduled HST for Tues 08/19/2019 at 4:00 at the [facility]. Nothing else needed at this time. Rhonda J Cobb

## 2019-08-06 NOTE — Telephone Encounter (Signed)
Called to schedule HST. No answer. LMOVM for pt to contact me at my direct phone number to schedule. [phone]. Rhonda J Cobb

## 2019-08-19 ENCOUNTER — Other Ambulatory Visit: Payer: Self-pay

## 2019-08-19 ENCOUNTER — Ambulatory Visit: Payer: 59

## 2019-08-19 DIAGNOSIS — R400 Somnolence: Secondary | ICD-10-CM

## 2019-08-19 DIAGNOSIS — G4733 Obstructive sleep apnea (adult) (pediatric): Secondary | ICD-10-CM | POA: Diagnosis not present

## 2019-08-27 ENCOUNTER — Telehealth: Payer: Self-pay | Admitting: Adult Health

## 2019-08-27 DIAGNOSIS — G4733 Obstructive sleep apnea (adult) (pediatric): Secondary | ICD-10-CM | POA: Diagnosis not present

## 2019-08-27 NOTE — Telephone Encounter (Signed)
-----   Message from Coralyn Helling, MD sent at 08/27/2019  3:59 PM EDT ----- Connor Herrera,  Home sleep study from 08/19/19 showed severe obstructive sleep apnea with an AHI of 54.3 and SpO2 low of 63%.  Probably needs in lab CPAP titration study.  Thanks.  Vineet

## 2019-08-27 NOTE — Telephone Encounter (Signed)
HST on 7/6 shows severe OSA -AHI of 54.3 and SpO2 low of 63%. Please let patient know that will need an in lab CPAP titration study to evaluate  This will help to determine how much CPAP pressure is needed to control his sleep apnea , also if another type of device might be needed and will help him to better learn about CPAP and get comfortable with CPAP and mask.   Please order CPAP titration , put in notes may use BIPAP if needed.

## 2019-08-28 NOTE — Telephone Encounter (Signed)
Attempted to call pt but unable to reach. Left message for him to return call. °

## 2019-08-29 NOTE — Telephone Encounter (Signed)
Spoke with pt, aware of results/recs.  cpap titration ordered.  Nothing further needed at this time- will close encounter.

## 2019-08-29 NOTE — Telephone Encounter (Signed)
Pt returning missed call [phone]

## 2019-09-08 ENCOUNTER — Telehealth: Payer: Self-pay | Admitting: Adult Health

## 2019-09-08 NOTE — Telephone Encounter (Signed)
Please see 08/27/2019 phone note.  cpap titration was ordered on 08/29/2019.  Lm for pt.

## 2019-09-10 NOTE — Telephone Encounter (Addendum)
Spoke to pt, who stated that he is scheduled for titration study on 10/03/2019. Nothing further is needed at this time.

## 2019-10-03 ENCOUNTER — Other Ambulatory Visit: Payer: Self-pay

## 2019-10-03 ENCOUNTER — Ambulatory Visit: Payer: 59 | Attending: Adult Health

## 2019-10-03 DIAGNOSIS — G4733 Obstructive sleep apnea (adult) (pediatric): Secondary | ICD-10-CM | POA: Diagnosis not present

## 2019-10-06 ENCOUNTER — Other Ambulatory Visit: Payer: Self-pay

## 2019-10-07 DIAGNOSIS — G4733 Obstructive sleep apnea (adult) (pediatric): Secondary | ICD-10-CM | POA: Diagnosis not present

## 2019-10-14 ENCOUNTER — Telehealth: Payer: Self-pay | Admitting: Adult Health

## 2019-10-14 DIAGNOSIS — G4733 Obstructive sleep apnea (adult) (pediatric): Secondary | ICD-10-CM

## 2019-10-14 NOTE — Telephone Encounter (Signed)
Titration study shows optimal control on CPAP 14 cm on titration study , med FF mask  Send order for CPAP 14cm Enroll in airview   Ov in 2 months

## 2019-10-15 NOTE — Telephone Encounter (Signed)
Called and left message for patient to return call.  

## 2019-10-15 NOTE — Telephone Encounter (Signed)
Called and left message for patient to return call. DME order sent for CPAP of 14 cm, med FF mask. Follow up recall in for 2 month return office visit.

## 2019-10-17 ENCOUNTER — Telehealth: Payer: Self-pay | Admitting: Adult Health

## 2019-10-17 DIAGNOSIS — G4733 Obstructive sleep apnea (adult) (pediatric): Secondary | ICD-10-CM

## 2019-10-17 NOTE — Telephone Encounter (Signed)
Spoke with pt and advised of CPAP titration results. New CPAP DME order placed. Pt verbalized understanding and will call office to schedule 2 mth rov once cpap is started.

## 2019-10-23 NOTE — Telephone Encounter (Signed)
Contacted patient with results of titration study. He is ready to start CPAP. Orders previously sent. Connor Herrera verbalized understanding of results and the need for follow up.

## 2020-06-17 ENCOUNTER — Encounter: Payer: Self-pay | Admitting: Family Medicine

## 2020-08-25 ENCOUNTER — Other Ambulatory Visit: Payer: Self-pay

## 2020-08-25 MED ORDER — METHYLPREDNISOLONE 4 MG PO TBPK: ORAL_TABLET | ORAL | 0 refills | Status: DC

## 2020-10-26 ENCOUNTER — Encounter: Payer: Self-pay | Admitting: Family Medicine

## 2021-05-17 ENCOUNTER — Encounter: Payer: Self-pay | Admitting: Physician Assistant

## 2021-05-17 ENCOUNTER — Ambulatory Visit (INDEPENDENT_AMBULATORY_CARE_PROVIDER_SITE_OTHER): Payer: 59 | Admitting: Physician Assistant

## 2021-05-17 DIAGNOSIS — M25561 Pain in right knee: Secondary | ICD-10-CM | POA: Insufficient documentation

## 2021-05-17 NOTE — Progress Notes (Signed)
? ?Office Visit Note ?  ?Patient: Connor Herrera           ?Date of Birth: Aug 21, 1985           ?MRN: [id number] ?Visit Date: 05/17/2021 ?             ?Requested by: [name], MD ?[address] [place] ?[place],  [place] [address] ?PCP: [name], MD ? ?Chief Complaint  ?Patient presents with  ? Right Knee - Follow-up  ? ? ? ? ?HPI: ?Connor Herrera is a pleasant 36 year old gentleman with a 3-day history of right posterior lateral knee pain he states he was playing baseball on Sunday and was batting.  He planted his right leg and went to pivot and his foot stayed in place.  He heard a pop and had pain in the posterior lateral aspect of his knee.  He does say he has a history of popping in his knees and ankles but this time the popping was more significant and painful.  He does have instability and that if he turns a certain way on uneven ground he gets pain that causes him to feel unstable.  No medial pain no anterior pain ? ?Assessment & Plan: ?Visit Diagnoses: No diagnosis found. ? ?Plan: Right knee sprain.  Cannot rule out a meniscal injury to the lateral posterior meniscus.  I am reassured and that he does not have an effusion and minimal swelling.  His exam is fairly benign today.  We will provide him with a hinged knee brace.  He can treat this symptomatically and wear the brace the next couple weeks.  He understands if he does not improve or things get worse would recommend an x-ray followed by an MRI to evaluate the meniscus. ? ?Follow-Up Instructions: No follow-ups on file.  ? ?Ortho Exam ? ?Patient is alert, oriented, no adenopathy, well-dressed, normal affect, normal respiratory effort. ?Examination of his right knee he has no effusion no increased warmth no redness.  No tenderness around the patellar and the patella tendon.  Is able to fire his quad without difficulty.  No tenderness over the medial joint line no pain with varus and valgus stress no tenderness over the insertion of the  LCL or MCL.  Good endpoint on Lachman testing may be some tenderness in the far posterior lateral corner compartments of his lower leg are soft and nontender ? ?Imaging: ?No results found. ?No images are attached to the encounter. ? ?Labs: ?Lab Results  ?Component Value Date  ? HGBA1C 5.5 10/22/2017  ? HGBA1C 5.6 05/20/2015  ? ? ? ?Lab Results  ?Component Value Date  ? ALBUMIN 4.6 10/14/2018  ? ALBUMIN 4.4 10/22/2017  ? ? ?No results found for: MG ?No results found for: VD25OH ? ?No results found for: PREALBUMIN ? ?  Latest Ref Rng & Units 10/14/2018  ?  3:19 PM 03/01/2013  ?  9:21 PM  ?CBC EXTENDED  ?WBC 4.0 - 10.5 K/uL 8.2   9.9    ?RBC 4.22 - 5.81 Mil/uL 5.07   4.88    ?Hemoglobin 13.0 - 17.0 g/dL 62.5   63.8    ?HCT 39.0 - 52.0 % 48.0   44.8    ?Platelets 150.0 - 400.0 K/uL 268.0   203    ?NEUT# 1.7 - 7.7 K/uL  7.8    ?Lymph# 0.7 - 4.0 K/uL  1.4    ? ? ? ?There is no height or weight on file to calculate BMI. ? ?Orders:  ?No  orders of the defined types were placed in this encounter. ? ?No orders of the defined types were placed in this encounter. ? ? ? Procedures: ?No procedures performed ? ?Clinical Data: ?No additional findings. ? ?ROS: ? ?All other systems negative, except as noted in the HPI. ?Review of Systems ? ?Objective: ?Vital Signs: There were no vitals taken for this visit. ? ?Specialty Comments:  ?No specialty comments available. ? ?PMFS History: ?Patient Active Problem List  ? Diagnosis Date Noted  ? Pain in left wrist 06/03/2019  ? OSA (obstructive sleep apnea) 05/26/2019  ? Nasal congestion 05/26/2019  ? Palpitations 10/14/2018  ? Obesity, Class I, BMI 30.0-34.9 (see actual BMI) 10/25/2017  ? Elevated blood pressure reading in office without diagnosis of hypertension 10/25/2017  ? Plantar wart of left foot 08/20/2017  ? Hyperglycemia 05/20/2015  ? Hyperlipidemia 05/20/2015  ? Tinnitus, left 10/18/2010  ? Healthcare maintenance 10/12/2010  ? ?Past Medical History:  ?Diagnosis Date  ? History of  chicken pox   ? Significant use of alcohol 02/2013  ? LOC eval at ER  ?  ?Family History  ?Problem Relation Age of Onset  ? Diabetes Father   ? Arthritis Paternal Grandmother   ? Diabetes Paternal Grandmother   ? Cancer Paternal Grandmother   ?     breast  ? Kidney disease Paternal Grandfather   ? Coronary artery disease Paternal Grandfather 70  ? Stroke Neg Hx   ?  ?Past Surgical History:  ?Procedure Laterality Date  ? INNER EAR SURGERY  2001  ? left traumatic ruptured TM, lacross ball, residual tinnitus  ? ORIF WRIST FRACTURE  2002  ? left distal radial wrist fracture skateboarding  ? ?Social History  ? ?Occupational History  ? Occupation: [employer]  ?  Employer: OTHER  ?Tobacco Use  ? Smoking status: Never  ? Smokeless tobacco: Never  ?Substance and Sexual Activity  ? Alcohol use: Not Currently  ?  Comment: 6 drinks/weekend  ? Drug use: No  ? Sexual activity: Not on file  ? ? ? ? ? ?

## 2021-08-23 ENCOUNTER — Encounter: Payer: Self-pay | Admitting: Family Medicine

## 2021-08-23 ENCOUNTER — Ambulatory Visit (INDEPENDENT_AMBULATORY_CARE_PROVIDER_SITE_OTHER): Payer: 59 | Admitting: Family Medicine

## 2021-08-23 VITALS — BP 134/90 | HR 75 | Temp 98.2°F | Ht 74.0 in | Wt 248.1 lb

## 2021-08-23 DIAGNOSIS — R21 Rash and other nonspecific skin eruption: Secondary | ICD-10-CM | POA: Insufficient documentation

## 2021-08-23 DIAGNOSIS — G4733 Obstructive sleep apnea (adult) (pediatric): Secondary | ICD-10-CM

## 2021-08-23 DIAGNOSIS — E669 Obesity, unspecified: Secondary | ICD-10-CM | POA: Diagnosis not present

## 2021-08-23 NOTE — Progress Notes (Signed)
Patient ID: Connor Herrera, male    DOB: 10-05-1985, 36 y.o.   MRN: [id number]  This visit was conducted in person.  BP 134/90   Pulse 75   Temp 98.2 F (36.8 C) (Temporal)   Ht 6\' 2"  (1.88 m)   Wt 248 lb 2 oz (112.5 kg)   SpO2 97%   BMI 31.86 kg/m   BP Readings from Last 3 Encounters:  08/23/21 134/90  07/08/19 138/86  05/26/19 134/86    CC: check skin spots on ankles, discuss weight  Subjective:   HPI: Connor Herrera is a 36 y.o. male presenting on 08/23/2021 for Skin Lesion (C/o skin lesions on bilateral ankles. Denies any change in color/size.)   Spots on legs noted for several years, worse in sun, better in winter months but never fully go away.Overall asxs - no redness, itching, or tenderness.  No new lotions or creams.  No new medicines.  No change with different soap brands.   No fevers/chills, nausea, no recent tick bites.   OSA - newly using CPAP for 2 yrs at 14 cm H2O, full face mask. Sleep study 09/2019, has not returned to pulm.   Obesity - difficulty with losing weight. Appetite overall ok. Tried intermittent fasting x 4 months. Also tried hello fresh and partial keto diet. Also changed to diet sodas, then to plain flavored water. No regular exercise routine. Fairly active at work - 10/2019.   He is fasting today.      Relevant past medical, surgical, family and social history reviewed and updated as indicated. Interim medical history since our last visit reviewed. Allergies and medications reviewed and updated. Outpatient Medications Prior to Visit  Medication Sig Dispense Refill   fluticasone (FLONASE) 50 MCG/ACT nasal spray Place into both nostrils daily. As needed     methylPREDNISolone (MEDROL DOSEPAK) 4 MG TBPK tablet TAKE AS DIRECTED 21 tablet 0   No facility-administered medications prior to visit.     Per HPI unless specifically indicated in ROS section below Review of Systems  Objective:  BP 134/90   Pulse 75    Temp 98.2 F (36.8 C) (Temporal)   Ht 6\' 2"  (1.88 m)   Wt 248 lb 2 oz (112.5 kg)   SpO2 97%   BMI 31.86 kg/m   Wt Readings from Last 3 Encounters:  08/23/21 248 lb 2 oz (112.5 kg)  07/08/19 241 lb 3.2 oz (109.4 kg)  05/26/19 246 lb 4 oz (111.7 kg)      Physical Exam Vitals and nursing note reviewed.  Constitutional:      Appearance: Normal appearance. He is not ill-appearing.  Neck:     Thyroid: No thyroid mass or thyromegaly.  Cardiovascular:     Rate and Rhythm: Normal rate and regular rhythm.     Pulses: Normal pulses.     Heart sounds: Normal heart sounds. No murmur heard. Pulmonary:     Effort: Pulmonary effort is normal. No respiratory distress.     Breath sounds: Normal breath sounds. No wheezing, rhonchi or rales.  Musculoskeletal:        General: Swelling present. No tenderness. Normal range of motion.     Right lower leg: No edema.     Left lower leg: No edema.     Comments: Mild nonpitting swelling to bilateral ankles  Skin:    General: Skin is warm and dry.     Findings: Rash present.     Comments: Hyperpigmented macular rash to  R>L lower leg at lateral and medial ankle. Reticular veins to bilateral medial feet.   Neurological:     Mental Status: He is alert.  Psychiatric:        Mood and Affect: Mood normal.        Behavior: Behavior normal.        Assessment & Plan:   Problem List Items Addressed This Visit     Obesity, Class I, BMI 30.0-34.9 (see actual BMI)    Continue to encourage healthy diet and lifestyle to affect sustainable weight loss.  Reviewed recent dietary efforts for weight loss including intermittent fasting and partial keto diet, he currently is prepping meals for the week as well.  Discussed healthy dietary choices. With BMI over 30 he qualifies for bariatric medication if interested.  Discussed possible options, specifically Wegovy and Contrave, he will research these and let me know if interested.  Did discuss nationwide shortage of  [id number] as well as mechanism of action of Contrave.  Also discussed poor insurance coverage of bariatric medications and possible affordability issues.      Relevant Orders   Lipid panel   Comprehensive metabolic panel   TSH   OSA (obstructive sleep apnea)    Sleep study completed 2021, has been on CPAP treatment since then.  Has not followed up with pulmonology yet.      Skin rash - Primary    Rash suspicious for hemosiderin staining of the left greater than right leg raising the question of venous insufficiency-discussed this with patient, recommend leg elevation, getting plenty of water and avoiding salt in diet. Could consider compression stocking use.        No orders of the defined types were placed in this encounter.  Orders Placed This Encounter  Procedures   Lipid panel   Comprehensive metabolic panel   TSH     Patient instructions: Rash - possible pigment of skin due to venous issue - elevate legs, ensure good water intake, avoid salt. We can watch this for now.  Labs today.  Look into Wegovy (weekly injection) vs Contrave (daily pills) as weight loss options.  Let me know if interested in trying a medicine.   Follow up plan: Return if symptoms worsen or fail to improve.  [name], MD

## 2021-08-23 NOTE — Assessment & Plan Note (Signed)
Rash suspicious for hemosiderin staining of the left greater than right leg raising the question of venous insufficiency-discussed this with patient, recommend leg elevation, getting plenty of water and avoiding salt in diet. Could consider compression stocking use.

## 2021-08-23 NOTE — Patient Instructions (Addendum)
Rash - possible pigment of skin due to venous issue - elevate legs, ensure good water intake, avoid salt. We can watch this for now.  Labs today.  Look into Wegovy (weekly injection) vs Contrave (daily pills) as weight loss options.  Let me know if interested in trying a medicine.   DASH Eating Plan DASH stands for Dietary Approaches to Stop Hypertension. The DASH eating plan is a healthy eating plan that has been shown to: Reduce high blood pressure (hypertension). Reduce your risk for type 2 diabetes, heart disease, and stroke. Help with weight loss. What are tips for following this plan? Reading food labels Check food labels for the amount of salt (sodium) per serving. Choose foods with less than 5 percent of the Daily Value of sodium. Generally, foods with less than 300 milligrams (mg) of sodium per serving fit into this eating plan. To find whole grains, look for the word "whole" as the first word in the ingredient list. Shopping Buy products labeled as "low-sodium" or "no salt added." Buy fresh foods. Avoid canned foods and pre-made or frozen meals. Cooking Avoid adding salt when cooking. Use salt-free seasonings or herbs instead of table salt or sea salt. Check with your health care provider or pharmacist before using salt substitutes. Do not fry foods. Cook foods using healthy methods such as baking, boiling, grilling, roasting, and broiling instead. Cook with heart-healthy oils, such as olive, canola, avocado, soybean, or sunflower oil. Meal planning  Eat a balanced diet that includes: 4 or more servings of fruits and 4 or more servings of vegetables each day. Try to fill one-half of your plate with fruits and vegetables. 6-8 servings of whole grains each day. Less than 6 oz (170 g) of lean meat, poultry, or fish each day. A 3-oz (85-g) serving of meat is about the same size as a deck of cards. One egg equals 1 oz (28 g). 2-3 servings of low-fat dairy each day. One serving is 1  cup (237 mL). 1 serving of nuts, seeds, or beans 5 times each week. 2-3 servings of heart-healthy fats. Healthy fats called omega-3 fatty acids are found in foods such as walnuts, flaxseeds, fortified milks, and eggs. These fats are also found in cold-water fish, such as sardines, salmon, and mackerel. Limit how much you eat of: Canned or prepackaged foods. Food that is high in trans fat, such as some fried foods. Food that is high in saturated fat, such as fatty meat. Desserts and other sweets, sugary drinks, and other foods with added sugar. Full-fat dairy products. Do not salt foods before eating. Do not eat more than 4 egg yolks a week. Try to eat at least 2 vegetarian meals a week. Eat more home-cooked food and less restaurant, buffet, and fast food. Lifestyle When eating at a restaurant, ask that your food be prepared with less salt or no salt, if possible. If you drink alcohol: Limit how much you use to: 0-1 drink a day for women who are not pregnant. 0-2 drinks a day for men. Be aware of how much alcohol is in your drink. In the [place] one drink equals one 12 oz bottle of beer (355 mL), one 5 oz glass of wine (148 mL), or one 1 oz glass of hard liquor (44 mL). General information Avoid eating more than 2,300 mg of salt a day. If you have hypertension, you may need to reduce your sodium intake to 1,500 mg a day. Work with your health care provider to  maintain a healthy body weight or to lose weight. Ask what an ideal weight is for you. Get at least 30 minutes of exercise that causes your heart to beat faster (aerobic exercise) most days of the week. Activities may include walking, swimming, or biking. Work with your health care provider or dietitian to adjust your eating plan to your individual calorie needs. What foods should I eat? Fruits All fresh, dried, or frozen fruit. Canned fruit in natural juice (without added sugar). Vegetables Fresh or frozen vegetables (raw, steamed,  roasted, or grilled). Low-sodium or reduced-sodium tomato and vegetable juice. Low-sodium or reduced-sodium tomato sauce and tomato paste. Low-sodium or reduced-sodium canned vegetables. Grains Whole-grain or whole-wheat bread. Whole-grain or whole-wheat pasta. Brown rice. Connor Herrera. Bulgur. Whole-grain and low-sodium cereals. Pita bread. Low-fat, low-sodium crackers. Whole-wheat flour tortillas. Meats and other proteins Skinless chicken or [place]. Ground chicken or [place]. Pork with fat trimmed off. Fish and seafood. Egg whites. Dried beans, peas, or lentils. Unsalted nuts, nut butters, and seeds. Unsalted canned beans. Lean cuts of beef with fat trimmed off. Low-sodium, lean precooked or cured meat, such as sausages or meat loaves. Dairy Low-fat (1%) or fat-free (skim) milk. Reduced-fat, low-fat, or fat-free cheeses. Nonfat, low-sodium ricotta or cottage cheese. Low-fat or nonfat yogurt. Low-fat, low-sodium cheese. Fats and oils Soft margarine without trans fats. Vegetable oil. Reduced-fat, low-fat, or light mayonnaise and salad dressings (reduced-sodium). Canola, safflower, olive, avocado, soybean, and sunflower oils. Avocado. Seasonings and condiments Herbs. Spices. Seasoning mixes without salt. Other foods Unsalted popcorn and pretzels. Fat-free sweets. The items listed above may not be a complete list of foods and beverages you can eat. Contact a dietitian for more information. What foods should I avoid? Fruits Canned fruit in a light or heavy syrup. Fried fruit. Fruit in cream or butter sauce. Vegetables Creamed or fried vegetables. Vegetables in a cheese sauce. Regular canned vegetables (not low-sodium or reduced-sodium). Regular canned tomato sauce and paste (not low-sodium or reduced-sodium). Regular tomato and vegetable juice (not low-sodium or reduced-sodium). Connor Herrera. Olives. Grains Baked goods made with fat, such as croissants, muffins, or some breads. Dry pasta or rice meal  packs. Meats and other proteins Fatty cuts of meat. Ribs. Fried meat. Connor Herrera. Bologna, salami, and other precooked or cured meats, such as sausages or meat loaves. Fat from the back of a pig (fatback). Bratwurst. Salted nuts and seeds. Canned beans with added salt. Canned or smoked fish. Whole eggs or egg yolks. Chicken or [place] with skin. Dairy Whole or 2% milk, cream, and half-and-half. Whole or full-fat cream cheese. Whole-fat or sweetened yogurt. Full-fat cheese. Nondairy creamers. Whipped toppings. Processed cheese and cheese spreads. Fats and oils Butter. Stick margarine. Lard. Shortening. Ghee. Bacon fat. Tropical oils, such as coconut, palm kernel, or palm oil. Seasonings and condiments Onion salt, garlic salt, seasoned salt, table salt, and sea salt. Worcestershire sauce. Tartar sauce. Barbecue sauce. Teriyaki sauce. Soy sauce, including reduced-sodium. Steak sauce. Canned and packaged gravies. Fish sauce. Oyster sauce. Cocktail sauce. Store-bought horseradish. Ketchup. Mustard. Meat flavorings and tenderizers. Bouillon cubes. Hot sauces. Pre-made or packaged marinades. Pre-made or packaged taco seasonings. Relishes. Regular salad dressings. Other foods Salted popcorn and pretzels. The items listed above may not be a complete list of foods and beverages you should avoid. Contact a dietitian for more information. Where to find more information National Heart, Lung, and Blood Institute: https://wilson-eaton.com/ American Heart Association: www.heart.org Academy of Nutrition and Dietetics: www.eatright.[place]: www.kidney.org Summary The DASH eating plan is  a healthy eating plan that has been shown to reduce high blood pressure (hypertension). It may also reduce your risk for type 2 diabetes, heart disease, and stroke. When on the DASH eating plan, aim to eat more fresh fruits and vegetables, whole grains, lean proteins, low-fat dairy, and heart-healthy fats. With the DASH  eating plan, you should limit salt (sodium) intake to 2,300 mg a day. If you have hypertension, you may need to reduce your sodium intake to 1,500 mg a day. Work with your health care provider or dietitian to adjust your eating plan to your individual calorie needs. This information is not intended to replace advice given to you by your health care provider. Make sure you discuss any questions you have with your health care provider. Document Revised: 01/03/2019 Document Reviewed: 01/03/2019 Elsevier Patient Education  [place].

## 2021-08-23 NOTE — Assessment & Plan Note (Deleted)
Update cbg - fasting.

## 2021-08-23 NOTE — Assessment & Plan Note (Signed)
Sleep study completed 2021, has been on CPAP treatment since then.  Has not followed up with pulmonology yet.

## 2021-08-23 NOTE — Assessment & Plan Note (Signed)
Continue to encourage healthy diet and lifestyle to affect sustainable weight loss.  Reviewed recent dietary efforts for weight loss including intermittent fasting and partial keto diet, he currently is prepping meals for the week as well.  Discussed healthy dietary choices. With BMI over 30 he qualifies for bariatric medication if interested.  Discussed possible options, specifically Wegovy and Contrave, he will research these and let me know if interested.  Did discuss nationwide shortage of [id number] as well as mechanism of action of Contrave.  Also discussed poor insurance coverage of bariatric medications and possible affordability issues.

## 2021-08-24 LAB — COMPREHENSIVE METABOLIC PANEL
ALT: 33 U/L (ref 0–53)
AST: 24 U/L (ref 0–37)
Albumin: 4.8 g/dL (ref 3.5–5.2)
Alkaline Phosphatase: 77 U/L (ref 39–117)
BUN: 22 mg/dL (ref 6–23)
CO2: 28 mEq/L (ref 19–32)
Calcium: 9.9 mg/dL (ref 8.4–10.5)
Chloride: 100 mEq/L (ref 96–112)
Creatinine, Ser: 1.33 mg/dL (ref 0.40–1.50)
GFR: 67.99 mL/min (ref >60.00)
Glucose, Bld: 82 mg/dL (ref 70–99)
Potassium: 4.5 mEq/L (ref 3.5–5.1)
Sodium: 137 mEq/L (ref 135–145)
Total Bilirubin: 0.4 mg/dL (ref 0.2–1.2)
Total Protein: 7.4 g/dL (ref 6.0–8.3)

## 2021-08-24 LAB — LIPID PANEL
Cholesterol: 203 mg/dL — ABNORMAL HIGH (ref 0–200)
HDL: 42.6 mg/dL (ref >39.00)
NonHDL: 160.39
Total CHOL/HDL Ratio: 5
Triglycerides: 348 mg/dL — ABNORMAL HIGH (ref 0.0–149.0)
VLDL: 69.6 mg/dL — ABNORMAL HIGH (ref 0.0–40.0)

## 2021-08-24 LAB — TSH: TSH: 2.43 u[IU]/mL (ref 0.35–5.50)

## 2021-08-24 LAB — LDL CHOLESTEROL, DIRECT: Direct LDL: 126 mg/dL

## 2021-08-28 ENCOUNTER — Encounter: Payer: Self-pay | Admitting: Family Medicine

## 2021-09-05 ENCOUNTER — Encounter: Payer: Self-pay | Admitting: Family Medicine

## 2021-09-13 MED ORDER — WEGOVY 0.25 MG/0.5ML ~~LOC~~ SOAJ: 0.2500 mg | SUBCUTANEOUS | 0 refills | Status: DC

## 2021-09-16 ENCOUNTER — Other Ambulatory Visit (HOSPITAL_COMMUNITY): Payer: Self-pay

## 2021-09-16 MED ORDER — WEGOVY 0.25 MG/0.5ML ~~LOC~~ SOAJ
0.2500 mg | SUBCUTANEOUS | 0 refills | Status: DC
  Filled 2021-09-16: qty 2, 28d supply, fill #0

## 2021-09-16 NOTE — Addendum Note (Signed)
Addended by: [name] on: 09/16/2021 04:57 PM   Modules accepted: Orders

## 2021-12-09 ENCOUNTER — Telehealth: Payer: Self-pay | Admitting: Family Medicine

## 2021-12-09 NOTE — Telephone Encounter (Signed)
Per appt notes pt already has appt to see Dr Lorelei Pont on 12/12/21 at 2 PM. Sending note to Dr Lorelei Pont, Juluis Rainier to Dr Darnell Level as PCP and Lattie Haw CMA.

## 2021-12-09 NOTE — Telephone Encounter (Signed)
Patient called in stating the when he got to work this morning and looked at his computer,he experienced spotty vision. He wasn't having any other symptoms,and this only occurred one time. He is fine now,but is curious as to what may have caused it. I sent him to acces nurse to be triaged.

## 2021-12-09 NOTE — Telephone Encounter (Addendum)
Plz call - does he have an eye doctor? Would recommend he call to schedule appointment with eye doctor.  Also would have him check BP to ensure it's ok.

## 2021-12-09 NOTE — Telephone Encounter (Signed)
Spoke with pt notifying him of Dr. Synthia Innocent message.  Pt states he does have an eye doc and will schedule an appt.  However, he is not able to check BP at the moment but will as soon as he can and will let us know the results.

## 2021-12-09 NOTE — Telephone Encounter (Signed)
[facility] - Client TELEPHONE ADVICE RECORD [facility] Patient Name: Connor Herrera Gender: Male DOB: [birth year] Age: 36 Y 84 M 27 D Return Phone Number: [phone] (Primary) Address: City/ State/ Zip: [place] [address] Client [facility] - Client Client Site [place] Provider Ria Bush - MD Contact Type Call Who Is Calling Patient / Member / Family / Caregiver Call Type Triage / Clinical Relationship To Patient Self Return Phone Number [phone] (Primary) Chief Complaint Vision - (non urgent symptoms) Reason for Call Symptomatic / Request for [place] states when he got to work he experienced blurry vison and seeing spots. His vision has come back. Translation No Nurse Assessment Nurse: Zorita Pang, RN, Deborah Date/Time (Eastern Time): 12/09/2021 10:53:35 AM Confirm and document reason for call. If symptomatic, describe symptoms. ---The caller states that he was seeing spot when he got to work. It was dark and he saw lights. Everything is clear. Does the patient have any new or worsening symptoms? ---Yes Will a triage be completed? ---Yes Related visit to physician within the last 2 weeks? ---No Does the PT have any chronic conditions? (i.e. diabetes, asthma, this includes High risk factors for pregnancy, etc.) ---No Is this a behavioral health or substance abuse call? ---No Guidelines Guideline Title Affirmed Question Affirmed Notes Nurse Date/Time (Eastern Time) Vision Loss or Change [1] Brief (now gone) blurred vision AND [2] unexplained Womble, RN, Neoma Laming 12/09/2021 10:56:19 AM Disp. Time Eilene Ghazi Time) Disposition Final User 12/09/2021 11:00:07 AM SEE PCP WITHIN 3 DAYS Yes Zorita Pang, RN, Deborah Final Disposition 12/09/2021 11:00:07 AM SEE PCP WITHIN 3 DAYS Yes Womble, RN, Deborah PLEASE NOTE: All timestamps  contained within this report are represented as [place] Standard Time. CONFIDENTIALTY NOTICE: This fax transmission is intended only for the addressee. It contains information that is legally privileged, confidential or otherwise protected from use or disclosure. If you are not the intended recipient, you are strictly prohibited from reviewing, disclosing, copying using or disseminating any of this information or taking any action in reliance on or regarding this information. If you have received this fax in error, please notify us immediately by telephone so that we can arrange for its return to [place]. Phone: [phone], Toll-Free: [phone], Fax: [phone] Page: 2 of 2 Call Id: [id number] [place] Disagree/Comply Comply Caller Understands Yes PreDisposition Call Doctor Care Advice Given Per Guideline * You need to be seen within 2 or 3 days. * You develop other problems with vision. * Blurred vision recurs Comments User: Marquis Buggy, RN Date/Time Eilene Ghazi Time): 12/09/2021 11:02:20 AM Caller states that he had one episode of seeing a spot that moved with his eye movement. He states that he had no pain and has never experienced anything like this. Wears contacts and no history of floaters, takes no medication, and denies any chronic medical problems. He states that he does not see the spot now and the spot did not stay in his visual field very long. Referrals REFERRED TO PCP OFFIC

## 2021-12-10 ENCOUNTER — Encounter: Payer: Self-pay | Admitting: Family Medicine

## 2021-12-12 ENCOUNTER — Ambulatory Visit (INDEPENDENT_AMBULATORY_CARE_PROVIDER_SITE_OTHER): Payer: 59 | Admitting: Family Medicine

## 2021-12-12 VITALS — BP 140/82 | HR 76 | Temp 97.8°F | Ht 74.0 in | Wt 254.5 lb

## 2021-12-12 DIAGNOSIS — H539 Unspecified visual disturbance: Secondary | ICD-10-CM | POA: Diagnosis not present

## 2021-12-12 DIAGNOSIS — G43109 Migraine with aura, not intractable, without status migrainosus: Secondary | ICD-10-CM

## 2021-12-12 NOTE — Progress Notes (Unsigned)
    Connor Herrera T. Connor Rasmusson, MD, [facility] [place], [address]  Phone: [phone]  FAX: [place] - 36 y.o. male  MRN [id number]  Date of Birth: October 15, 1985  Date: 12/12/2021  PCP: Ria Bush, MD  Referral: Ria Bush, MD  Chief Complaint  Patient presents with   Eye Problem    Saw a black spot in vision,that lasted 15 mins, on Friday. Saw Eye dr today and they stated it was a migraine.    Subjective:   Connor Herrera is a 36 y.o. very pleasant male patient with Body mass index is 32.68 kg/m. who presents with the following:  Patient presents with some visual changes and having noted a black spot in his vision.  Was driving last week, but then when he got to work, he had some vision changes.  While using his computer - it was lit and there was a circle that was 60% of the computer screen.  Felt like it was making words, but he could not see it, and it was dark and he could not fully see them.  15 minutes later, it was completely gone.  Friday through Sunday did have some head pain and it went away since then.    Only had a headache when he was coughing.  No other visual changes.    No migraines in the past.   He went to the eye doctor today, and they felt that his retina and entire eye was completely normal.    They felt that most likely diagnosis was an ocular migraine.   Review of Systems is noted in the HPI, as appropriate  Objective:   BP (!) 140/82   Pulse 76   Temp 97.8 F (36.6 C) (Temporal)   Ht 6\' 2"  (1.88 m)   Wt 254 lb 8 oz (115.4 kg)   SpO2 96%   BMI 32.68 kg/m   GEN: No acute distress; alert,appropriate. PULM: Breathing comfortably in no respiratory distress  Neuro: CN 2-12 grossly intact. PERRLA. EOMI. Sensation intact throughout. Str 5/5 all extremities. DTR 2+. No clonus. A and o x 4. Romberg neg. Finger nose neg. Heel -shin neg.    PSYCH: Normally interactive. Conversant. Not depressed or anxious appearing.  Calm demeanor.    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 Visual changes  H53.9      Visual change with darkness lasted 15 minutes and then promptly resolved.  Reassuring eye exam today, and the eye was felt to be normal by ophthalmology.  Most likely diagnosis would be ocular migraine, and this is completely resolved.  He has not had this persistently, and at this point I think it no other additional intervention or study needs to be completed  Medications Discontinued During This Encounter  Medication Reason   Semaglutide-Weight Management (WEGOVY) 0.25 MG/0.5ML SOAJ Patient Preference    Disposition: As needed  Dr[employer] One speech-to-text software was used for transcription in this dictation.  Possible transcriptional errors can occur using Editor, commissioning.   Signed,  Maud Deed. Lendy Dittrich, MD   Outpatient Encounter Medications as of 12/12/2021  Medication Sig   [DISCONTINUED] Semaglutide-Weight Management (WEGOVY) 0.25 MG/0.5ML SOAJ Inject 0.25 mg into the skin once a week.   No facility-administered encounter medications on file as of 12/12/2021.

## 2021-12-13 ENCOUNTER — Encounter: Payer: Self-pay | Admitting: Family Medicine

## 2021-12-13 NOTE — Telephone Encounter (Signed)
Noted. Pt seen by Dr Lorelei Pont yesterday.

## 2022-08-17 ENCOUNTER — Other Ambulatory Visit: Payer: Self-pay | Admitting: Family Medicine

## 2022-08-17 DIAGNOSIS — E781 Pure hyperglyceridemia: Secondary | ICD-10-CM

## 2022-08-17 DIAGNOSIS — R21 Rash and other nonspecific skin eruption: Secondary | ICD-10-CM

## 2022-08-17 DIAGNOSIS — Z1159 Encounter for screening for other viral diseases: Secondary | ICD-10-CM

## 2022-08-23 ENCOUNTER — Encounter: Payer: Self-pay | Admitting: Family Medicine

## 2022-08-23 ENCOUNTER — Other Ambulatory Visit (INDEPENDENT_AMBULATORY_CARE_PROVIDER_SITE_OTHER): Payer: 59

## 2022-08-23 ENCOUNTER — Ambulatory Visit (INDEPENDENT_AMBULATORY_CARE_PROVIDER_SITE_OTHER): Payer: 59

## 2022-08-23 DIAGNOSIS — Z131 Encounter for screening for diabetes mellitus: Secondary | ICD-10-CM | POA: Diagnosis not present

## 2022-08-23 DIAGNOSIS — R21 Rash and other nonspecific skin eruption: Secondary | ICD-10-CM | POA: Diagnosis not present

## 2022-08-23 DIAGNOSIS — E781 Pure hyperglyceridemia: Secondary | ICD-10-CM | POA: Diagnosis not present

## 2022-08-23 DIAGNOSIS — Z833 Family history of diabetes mellitus: Secondary | ICD-10-CM

## 2022-08-23 DIAGNOSIS — Z1159 Encounter for screening for other viral diseases: Secondary | ICD-10-CM

## 2022-08-23 LAB — LIPID PANEL
Cholesterol: 186 mg/dL (ref 0–200)
HDL: 39.8 mg/dL (ref >39.00)
LDL Cholesterol: 121 mg/dL — ABNORMAL HIGH (ref 0–99)
NonHDL: 146.15
Total CHOL/HDL Ratio: 5
Triglycerides: 126 mg/dL (ref 0.0–149.0)
VLDL: 25.2 mg/dL (ref 0.0–40.0)

## 2022-08-23 LAB — COMPREHENSIVE METABOLIC PANEL
ALT: 28 U/L (ref 0–53)
AST: 19 U/L (ref 0–37)
Albumin: 4.5 g/dL (ref 3.5–5.2)
Alkaline Phosphatase: 73 U/L (ref 39–117)
BUN: 24 mg/dL — ABNORMAL HIGH (ref 6–23)
CO2: 27 mEq/L (ref 19–32)
Calcium: 9.4 mg/dL (ref 8.4–10.5)
Chloride: 103 mEq/L (ref 96–112)
Creatinine, Ser: 1.47 mg/dL (ref 0.40–1.50)
GFR: 59.88 mL/min — ABNORMAL LOW (ref >60.00)
Glucose, Bld: 112 mg/dL — ABNORMAL HIGH (ref 70–99)
Potassium: 4.1 mEq/L (ref 3.5–5.1)
Sodium: 137 mEq/L (ref 135–145)
Total Bilirubin: 0.4 mg/dL (ref 0.2–1.2)
Total Protein: 7.3 g/dL (ref 6.0–8.3)

## 2022-08-23 LAB — CBC WITH DIFFERENTIAL/PLATELET
Basophils Absolute: 0.1 10*3/uL (ref 0.0–0.1)
Basophils Relative: 0.9 % (ref 0.0–3.0)
Eosinophils Absolute: 0.1 10*3/uL (ref 0.0–0.7)
Eosinophils Relative: 2.2 % (ref 0.0–5.0)
HCT: 45.8 % (ref 39.0–52.0)
Hemoglobin: 15.3 g/dL (ref 13.0–17.0)
Lymphocytes Relative: 33.7 % (ref 12.0–46.0)
Lymphs Abs: 2 10*3/uL (ref 0.7–4.0)
MCHC: 33.4 g/dL (ref 30.0–36.0)
MCV: 92.3 fl (ref 78.0–100.0)
Monocytes Absolute: 0.5 10*3/uL (ref 0.1–1.0)
Monocytes Relative: 7.9 % (ref 3.0–12.0)
Neutro Abs: 3.3 10*3/uL (ref 1.4–7.7)
Neutrophils Relative %: 55.3 % (ref 43.0–77.0)
Platelets: 246 10*3/uL (ref 150.0–400.0)
RBC: 4.97 Mil/uL (ref 4.22–5.81)
RDW: 13.3 % (ref 11.5–15.5)
WBC: 5.9 10*3/uL (ref 4.0–10.5)

## 2022-08-23 LAB — HEMOGLOBIN A1C: Hgb A1c MFr Bld: 5.6 % (ref 4.6–6.5)

## 2022-08-23 NOTE — Addendum Note (Signed)
Addended by: Alvina Chou on: 08/23/2022 02:54 PM   Modules accepted: Orders

## 2022-08-23 NOTE — Telephone Encounter (Signed)
Can we add A1c to blood drawn today? Thanks.  I put in future order

## 2022-08-24 LAB — HEPATITIS C ANTIBODY: Hepatitis C Ab: NONREACTIVE

## 2022-08-30 ENCOUNTER — Encounter: Payer: Self-pay | Admitting: Family Medicine

## 2022-08-30 ENCOUNTER — Ambulatory Visit: Payer: 59 | Admitting: Family Medicine

## 2022-08-30 VITALS — BP 134/86 | HR 83 | Temp 97.9°F | Ht 71.5 in | Wt 246.2 lb

## 2022-08-30 DIAGNOSIS — G4733 Obstructive sleep apnea (adult) (pediatric): Secondary | ICD-10-CM

## 2022-08-30 DIAGNOSIS — Z0000 Encounter for general adult medical examination without abnormal findings: Secondary | ICD-10-CM | POA: Diagnosis not present

## 2022-08-30 DIAGNOSIS — E669 Obesity, unspecified: Secondary | ICD-10-CM | POA: Diagnosis not present

## 2022-08-30 DIAGNOSIS — E785 Hyperlipidemia, unspecified: Secondary | ICD-10-CM | POA: Diagnosis not present

## 2022-08-30 MED ORDER — PHENTERMINE HCL 30 MG PO CAPS: 30.0000 mg | ORAL_CAPSULE | ORAL | 0 refills | Status: DC

## 2022-08-30 NOTE — Patient Instructions (Addendum)
Look into Noom mindset book.  Trial Phentermine 30mg  daily in the morning. Let us know if any trouble tolerating this.  If started, return in 4-6 weeks for follow up weight.  Monitor blood pressures at home - goal <140/90, ideally lower if tolerated Good to see you today

## 2022-08-30 NOTE — Assessment & Plan Note (Signed)
 Preventative protocols reviewed and updated unless pt declined. Discussed healthy diet and lifestyle.  

## 2022-08-30 NOTE — Progress Notes (Signed)
Ph: [phone] Fax: [phone]   Patient ID: Connor Herrera, male    DOB: 09-04-1985, 37 y.o.   MRN: [id number]  This visit was conducted in person.  BP 134/86   Pulse 83   Temp 97.9 F (36.6 C) (Temporal)   Ht 5' 11.5" (1.816 m)   Wt 246 lb 4 oz (111.7 kg)   SpO2 98%   BMI 33.87 kg/m   BP Readings from Last 3 Encounters:  08/30/22 134/86  12/12/21 (!) 140/82  08/23/21 134/90   CC: CPE Subjective:   HPI: [name] is a 37 y.o. male presenting on 08/30/2022 for Annual Exam   Episode of vision change, didn't have any associated headache 11/2021 - saw Dr Patsy Lager thought possible ocular migraine. Also saw ophthalmologist. No subsequent episodes.   Obesity - was unable to try wegovy due to shortage. 8 lbs down in the past year. Tried intermittent fasting 4-6 months without much benefit (only eating dinner), also tried modified keto diet. He's cut out sugared soft drinks, now drinking diet soft drinks. Drinks flavored waters.  He did follow Hello Fresh diet.  Has not been on weight loss medicines.   Preventative: Flu shot today COVID vaccine - x2 Tdap 2012, 12/2017 Seat belt use discussed Sunscreen use discussed. No changing moles on skin.  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 Interim medical history since our last visit reviewed. Allergies and medications reviewed and updated. No outpatient medications prior to visit.   No facility-administered medications prior to visit.     Per HPI unless  specifically indicated in ROS section below Review of Systems  Constitutional:  Negative for activity change, appetite change, chills, fatigue, fever and unexpected weight change.  HENT:  Negative for hearing loss.   Eyes:  Negative for visual disturbance.  Respiratory:  Negative for cough, chest tightness, shortness of breath and wheezing.   Cardiovascular:  Negative for chest pain, palpitations and leg swelling.  Gastrointestinal:  Positive for constipation. Negative for abdominal distention, abdominal pain, blood in stool, diarrhea, nausea and vomiting.  Genitourinary:  Negative for difficulty urinating and hematuria.  Musculoskeletal:  Negative for arthralgias, myalgias and neck pain.  Skin:  Negative for rash.  Neurological:  Negative for dizziness, seizures, syncope and headaches.  Hematological:  Negative for adenopathy. Does not bruise/bleed easily.  Psychiatric/Behavioral:  Negative for dysphoric mood. The patient is not nervous/anxious.     Objective:  BP 134/86   Pulse 83   Temp 97.9 F (36.6 C) (Temporal)   Ht 5' 11.5" (1.816 m)   Wt 246 lb 4 oz (111.7 kg)   SpO2 98%   BMI 33.87 kg/m   Wt Readings from Last 3 Encounters:  08/30/22 246 lb 4 oz (111.7 kg)  12/12/21 254 lb 8 oz (115.4 kg)  08/23/21 248 lb 2 oz (112.5 kg)      Physical Exam Vitals and nursing note reviewed.  Constitutional:      General: He is not in acute distress.    Appearance: Normal appearance. He is well-developed. He  is not ill-appearing.  HENT:     Head: Normocephalic and atraumatic.     Right Ear: Hearing, tympanic membrane, ear canal and external ear normal.     Left Ear: Hearing, tympanic membrane, ear canal and external ear normal.     Mouth/Throat:     Mouth: Mucous membranes are moist.     Pharynx: Oropharynx is clear. No oropharyngeal exudate or posterior oropharyngeal erythema.  Eyes:     General: No scleral icterus.    Extraocular Movements: Extraocular movements intact.      Conjunctiva/sclera: Conjunctivae normal.     Pupils: Pupils are equal, round, and reactive to light.  Neck:     Thyroid: No thyroid mass or thyromegaly.  Cardiovascular:     Rate and Rhythm: Normal rate and regular rhythm.     Pulses: Normal pulses.          Radial pulses are 2+ on the right side and 2+ on the left side.     Heart sounds: Normal heart sounds. No murmur heard. Pulmonary:     Effort: Pulmonary effort is normal. No respiratory distress.     Breath sounds: Normal breath sounds. No wheezing, rhonchi or rales.  Abdominal:     General: Bowel sounds are normal. There is no distension.     Palpations: Abdomen is soft. There is no mass.     Tenderness: There is no abdominal tenderness. There is no guarding or rebound.     Hernia: No hernia is present.  Musculoskeletal:        General: Normal range of motion.     Cervical back: Normal range of motion and neck supple.     Right lower leg: No edema.     Left lower leg: No edema.  Lymphadenopathy:     Cervical: No cervical adenopathy.  Skin:    General: Skin is warm and dry.     Findings: No rash.  Neurological:     General: No focal deficit present.     Mental Status: He is alert and oriented to person, place, and time.  Psychiatric:        Mood and Affect: Mood normal.        Behavior: Behavior normal.        Thought Content: Thought content normal.        Judgment: Judgment normal.       Results for orders placed or performed in visit on 08/23/22  Hemoglobin A1c  Result Value Ref Range   Hgb A1c MFr Bld 5.6 4.6 - 6.5 %   Lab Results  Component Value Date   CHOL 186 08/23/2022   HDL 39.80 08/23/2022   LDLCALC 121 (H) 08/23/2022   LDLDIRECT 126.0 08/23/2021   TRIG 126.0 08/23/2022   CHOLHDL 5 08/23/2022    Lab Results  Component Value Date   NA 137 08/23/2022   CL 103 08/23/2022   K 4.1 08/23/2022   CO2 27 08/23/2022   BUN 24 (H) 08/23/2022   CREATININE 1.47 08/23/2022   GFR 59.88 (L) 08/23/2022    CALCIUM 9.4 08/23/2022   ALBUMIN 4.5 08/23/2022   GLUCOSE 112 (H) 08/23/2022    Lab Results  Component Value Date   ALT 28 08/23/2022   AST 19 08/23/2022   ALKPHOS 73 08/23/2022   BILITOT 0.4 08/23/2022   Lab Results  Component Value Date   WBC 5.9 08/23/2022   HGB 15.3 08/23/2022   HCT 45.8 08/23/2022   MCV 92.3 08/23/2022  PLT 246.0 08/23/2022    Lab Results  Component Value Date   TSH 2.43 08/23/2021   No results found for: "TESTOSTERONE"   Assessment & Plan:   Problem List Items Addressed This Visit     Healthcare maintenance - Primary (Chronic)    Preventative protocols reviewed and updated unless pt declined. Discussed healthy diet and lifestyle.       Dyslipidemia    Mild off meds. Reviewed diet choices to help control cholesterol levels.      Obesity, Class I, BMI 30.0-34.9 (see actual BMI)    Encouraged healthy diet and lifestyle choices for sustainable weight loss.  Was unable to fill Wegovy due to nationwide shortage.  Discussed bariatric med alternatives including Contrave and phentermine including side effects, risks, mechanism of action of meds.  Desires to try phentermine - 30mg  tablets sent to pharmacy.  Discussed healthy diet.  Discussed need for regular visits for weight management to monitor medication effect and tolerance and weight loss, rec return 4-6 wks after starting medication.       OSA (obstructive sleep apnea)    On CPAP        Meds ordered this encounter  Medications   phentermine 30 MG capsule    Sig: Take 1 capsule (30 mg total) by mouth every morning.    Dispense:  30 capsule    Refill:  0    No orders of the defined types were placed in this encounter.   Patient Instructions  Look into Noom mindset book.  Trial Phentermine 30mg  daily in the morning. Let us know if any trouble tolerating this.  If started, return in 4-6 weeks for follow up weight.  Monitor blood pressures at home - goal <140/90, ideally lower if  tolerated Good to see you today  Follow up plan: Return in about 6 weeks (around 10/11/2022) for follow up visit.  [name], MD

## 2022-08-30 NOTE — Assessment & Plan Note (Addendum)
Mild off meds. Reviewed diet choices to help control cholesterol levels.

## 2022-08-31 NOTE — Assessment & Plan Note (Addendum)
Encouraged healthy diet and lifestyle choices for sustainable weight loss.  Was unable to fill Wegovy due to nationwide shortage.  Discussed bariatric med alternatives including Contrave and phentermine including side effects, risks, mechanism of action of meds.  Desires to try phentermine - 30mg  tablets sent to pharmacy.  Discussed healthy diet.  Discussed need for regular visits for weight management to monitor medication effect and tolerance and weight loss, rec return 4-6 wks after starting medication.

## 2022-08-31 NOTE — Assessment & Plan Note (Signed)
On CPAP. ?

## 2022-10-04 ENCOUNTER — Ambulatory Visit (INDEPENDENT_AMBULATORY_CARE_PROVIDER_SITE_OTHER): Payer: 59 | Admitting: Family Medicine

## 2022-10-04 ENCOUNTER — Encounter: Payer: Self-pay | Admitting: Family Medicine

## 2022-10-04 VITALS — BP 138/84 | HR 82 | Temp 98.9°F | Ht 74.0 in | Wt 244.0 lb

## 2022-10-04 DIAGNOSIS — R030 Elevated blood-pressure reading, without diagnosis of hypertension: Secondary | ICD-10-CM | POA: Diagnosis not present

## 2022-10-04 DIAGNOSIS — E669 Obesity, unspecified: Secondary | ICD-10-CM | POA: Diagnosis not present

## 2022-10-04 DIAGNOSIS — Z6831 Body mass index (BMI) 31.0-31.9, adult: Secondary | ICD-10-CM

## 2022-10-04 MED ORDER — PHENTERMINE HCL 37.5 MG PO CAPS: 37.5000 mg | ORAL_CAPSULE | ORAL | 1 refills | Status: DC

## 2022-10-04 NOTE — Assessment & Plan Note (Signed)
BP stable off medication. Anticipate weight loss is helping.

## 2022-10-04 NOTE — Patient Instructions (Addendum)
You are doing well today  Continue phentermine at higher 37.5mg  daily dose.  Return in 2 months for follow up visit

## 2022-10-04 NOTE — Progress Notes (Signed)
Ph: [phone] Fax: [phone]   Patient ID: Connor Herrera, male    DOB: [birth year], 37 y.o.   MRN: [id number]  This visit was conducted in person.  BP 138/84   Pulse 82   Temp 98.9 F (37.2 C) (Temporal)   Ht 6\' 2"  (1.88 m)   Wt 244 lb (110.7 kg)   SpO2 98%   BMI 31.33 kg/m   BP Readings from Last 3 Encounters:  10/04/22 138/84  08/30/22 134/86  12/12/21 (!) 140/82   CC: weight management visit  Subjective:   HPI: Connor Herrera is a 37 y.o. male presenting on 10/04/2022 for Follow-up   Starting weight: 256 lbs Last weight: 256 lbs (pt states this is correct initial weight) Today's weight 244 lbs  On phentermine 30mg  daily since last month 08/2022 Unable to try Wegovy due to national shortage.  IF x6 months without significant benefit.   No HA, chest pain, insomnia, mood changes, tics.   24 hour recall: 9am breakfast - 2 boiled eggs, water  12pm lunch - leftover home made ACP 1 cup - with rice chicken and zucchini with water 7pm dinner - pasta tomatoes, zucchini, garlic with andouille sausage 1 cup with diet dr pepper  Diet Dr Reino Kent at bedtime   Activity regimen: Active at home, yardwork    OSA - continues CPAP      Relevant past medical, surgical, family and social history reviewed and updated as indicated. Interim medical history since our last visit reviewed. Allergies and medications reviewed and updated. Outpatient Medications Prior to Visit  Medication Sig Dispense Refill   phentermine 30 MG capsule Take 1 capsule (30 mg total) by mouth every morning. 30 capsule 0   No facility-administered medications prior to visit.     Per HPI unless specifically indicated in ROS section below Review of Systems  Objective:  BP 138/84   Pulse 82   Temp 98.9 F (37.2 C) (Temporal)   Ht 6\' 2"  (1.88 m)   Wt 244 lb (110.7 kg)   SpO2 98%   BMI 31.33 kg/m   Wt Readings from Last 3 Encounters:  10/04/22 244 lb (110.7 kg)  08/30/22 246  lb 4 oz (111.7 kg)  12/12/21 254 lb 8 oz (115.4 kg)      Physical Exam Vitals and nursing note reviewed.  Constitutional:      Appearance: Normal appearance. He is not ill-appearing.  HENT:     Head: Normocephalic and atraumatic.     Mouth/Throat:     Mouth: Mucous membranes are moist.     Pharynx: Oropharynx is clear. No oropharyngeal exudate or posterior oropharyngeal erythema.  Eyes:     Extraocular Movements: Extraocular movements intact.     Conjunctiva/sclera: Conjunctivae normal.     Pupils: Pupils are equal, round, and reactive to light.  Neck:     Thyroid: No thyroid mass or thyromegaly.  Cardiovascular:     Rate and Rhythm: Normal rate and regular rhythm.     Pulses: Normal pulses.     Heart sounds: Normal heart sounds. No murmur heard. Pulmonary:     Effort: Pulmonary effort is normal. No respiratory distress.     Breath sounds: Normal breath sounds. No wheezing, rhonchi or rales.  Musculoskeletal:     Cervical back: Normal range of motion and neck supple.     Right lower leg: No edema.     Left lower leg: No edema.  Skin:    General: Skin is warm  and dry.     Findings: No rash.  Neurological:     Mental Status: He is alert.  Psychiatric:        Mood and Affect: Mood normal.        Behavior: Behavior normal.       Results for orders placed or performed in visit on 08/23/22  Hemoglobin A1c  Result Value Ref Range   Hgb A1c MFr Bld 5.6 4.6 - 6.5 %    Assessment & Plan:   Problem List Items Addressed This Visit     Obesity, Class I, BMI 30.0-34.9 (see actual BMI) - Primary    Congratulated on weight loss to date - 12 lbs in the past month!  Tolerating phentermine well, started 08/2022.  Will continue phentermine, increase to full dose 37.5mg  daily.  RTC 2 mo f/u visit.  Discussed short term phentermine use of no more than 4-6 months.       Elevated blood pressure reading in office without diagnosis of hypertension    BP stable off  medication. Anticipate weight loss is helping.         Meds ordered this encounter  Medications   phentermine 37.5 MG capsule    Sig: Take 1 capsule (37.5 mg total) by mouth every morning.    Dispense:  30 capsule    Refill:  1    No orders of the defined types were placed in this encounter.   Patient Instructions  You are doing well today  Continue phentermine at higher 37.5mg  daily dose.  Return in 2 months for follow up visit  Follow up plan: Return in about 2 months (around 12/04/2022) for follow up visit.  [name], MD

## 2022-10-04 NOTE — Assessment & Plan Note (Signed)
Congratulated on weight loss to date - 12 lbs in the past month!  Tolerating phentermine well, started 08/2022.  Will continue phentermine, increase to full dose 37.5mg  daily.  RTC 2 mo f/u visit.  Discussed short term phentermine use of no more than 4-6 months.

## 2022-12-04 ENCOUNTER — Encounter: Payer: Self-pay | Admitting: Family Medicine

## 2022-12-04 ENCOUNTER — Ambulatory Visit: Payer: 59 | Admitting: Family Medicine

## 2022-12-04 VITALS — BP 146/104 | HR 85 | Temp 98.3°F | Ht 74.0 in | Wt 243.1 lb

## 2022-12-04 DIAGNOSIS — R030 Elevated blood-pressure reading, without diagnosis of hypertension: Secondary | ICD-10-CM

## 2022-12-04 DIAGNOSIS — Z6832 Body mass index (BMI) 32.0-32.9, adult: Secondary | ICD-10-CM

## 2022-12-04 DIAGNOSIS — G4733 Obstructive sleep apnea (adult) (pediatric): Secondary | ICD-10-CM

## 2022-12-04 DIAGNOSIS — E66811 Obesity, class 1: Secondary | ICD-10-CM | POA: Diagnosis not present

## 2022-12-04 MED ORDER — WEGOVY 0.25 MG/0.5ML ~~LOC~~ SOAJ: 0.2500 mg | SUBCUTANEOUS | 0 refills | Status: DC

## 2022-12-04 MED ORDER — WEGOVY 0.5 MG/0.5ML ~~LOC~~ SOAJ: 0.5000 mg | SUBCUTANEOUS | 1 refills | Status: DC

## 2022-12-04 NOTE — Progress Notes (Addendum)
 Ph: [phone] Fax: [phone]   Patient ID: Connor Herrera, male    DOB: [birth year], 37 y.o.   MRN: [id number]  This visit was conducted in person.  BP (!) 146/104 (BP Location: Right Arm, Cuff Size: Large)   Pulse 85   Temp 98.3 F (36.8 C) (Oral)   Ht 6\' 2"  (1.88 m)   Wt 243 lb 2 oz (110.3 kg)   SpO2 98%   BMI 31.22 kg/m   BP Readings from Last 3 Encounters:  12/04/22 (!) 146/104  10/04/22 138/84  08/30/22 134/86   CC: 2 mo f/u visit  Subjective:   HPI: Connor Herrera is a 37 y.o. male presenting on 12/04/2022 for Medical Management of Chronic Issues (Here for 2 mo wt mgmt f/u.)   Starting weight: 256 lbs Last weight: 244 lbs Today's weight 243 lbs  On phentermine daily since 08/2022.  Tolerating well without headache, chest pain, insomnia, mood changes, tics.  Noted increased BP readings in office today. Does not check at home.   Unable to try wegovy due to national shortage.  IF x6 mo without significant benefit.   24 hour recall: 10am breakfast - [employer] burger with water  No snacks  No lunch  7pm dinner - beef nachos with cheese tomatoes and jalapeos from [employer] with diet mt dew   Activity regimen: Active at home, yardwork  OSA continues CPAP      Relevant past medical, surgical, family and social history reviewed and updated as indicated. Interim medical history since our last visit reviewed. Allergies and medications reviewed and updated. Outpatient Medications Prior to Visit  Medication Sig Dispense Refill   phentermine 37.5 MG capsule Take 1 capsule (37.5 mg total) by mouth every morning. 30 capsule 1   No facility-administered medications prior to visit.     Per HPI unless specifically indicated in ROS section below Review of Systems  Objective:  BP (!) 146/104 (BP Location: Right Arm, Cuff Size: Large)   Pulse 85   Temp 98.3 F (36.8 C) (Oral)   Ht 6\' 2"  (1.88 m)   Wt 243 lb 2 oz (110.3 kg)    SpO2 98%   BMI 31.22 kg/m   Wt Readings from Last 3 Encounters:  12/04/22 243 lb 2 oz (110.3 kg)  10/04/22 244 lb (110.7 kg)  08/30/22 246 lb 4 oz (111.7 kg)      Physical Exam Vitals and nursing note reviewed.  Constitutional:      Appearance: Normal appearance. He is not ill-appearing.  HENT:     Head: Normocephalic and atraumatic.     Mouth/Throat:     Mouth: Mucous membranes are moist.     Pharynx: Oropharynx is clear. No oropharyngeal exudate or posterior oropharyngeal erythema.  Eyes:     Extraocular Movements: Extraocular movements intact.     Pupils: Pupils are equal, round, and reactive to light.  Cardiovascular:     Rate and Rhythm: Normal rate and regular rhythm.     Pulses: Normal pulses.     Heart sounds: Normal heart sounds. No murmur heard. Pulmonary:     Effort: Pulmonary effort is normal. No respiratory distress.     Breath sounds: Normal breath sounds. No wheezing, rhonchi or rales.  Musculoskeletal:     Right lower leg: No edema.     Left lower leg: No edema.  Skin:    General: Skin is warm and dry.     Findings: No rash.  Neurological:  Mental Status: He is alert.  Psychiatric:        Mood and Affect: Mood normal.        Behavior: Behavior normal.       Results for orders placed or performed in visit on 08/23/22  Hemoglobin A1c   Collection Time: 08/23/22  3:24 PM  Result Value Ref Range   Hgb A1c MFr Bld 5.6 4.6 - 6.5 %    Assessment & Plan:   Problem List Items Addressed This Visit     Obesity, Class I, BMI 30.0-34.9 (see actual BMI) - Primary   Total 13 lb weight loss since starting phentermine late 08/2022.  BP elevation noted today, possibly phentermine effect. Will stop phentermine.   Did recommend re-pricing out Wegovy vs Zepbound.  He may be eligible for Zepbound coverage given concomitant OSA.  [name] to pharmacy. Provided with contact info for Zepbound direct manufacturer pharmacy to also price out.  If zepbound started,  he will not take with another GLP1rA or GLP1/GIP containing medication.   Patient is interested in [name]. Reviewed mechanism of action of medication as well as side effects and adverse events to watch for including nausea, diarrhea, constipation, pancreatitis. No known fmhx medullary thyroid cancer or MEN2. Discussed titration schedule for medication. Will start wegovy 0.25mg  weekly x 1 month then increase to 0.5mg  weekly. Discussed need for regular visits for weight management to monitor medication effect and tolerance and weight loss, rec return 1 month after starting medication.    Medication will be used along with lifestyle changes/interventions to achieve sustainable weight loss.       Elevated blood pressure reading in office without diagnosis of hypertension   BP elevation again noted today in setting of phentermine use. Stop phentermine, will reassess control at next visit.       OSA (obstructive sleep apnea)   Continues nightly CPAP use with benefit.         Meds ordered this encounter  Medications   DISCONTD: Semaglutide-Weight Management (WEGOVY) 0.25 MG/0.5ML SOAJ    Sig: Inject 0.25 mg into the skin once a week.    Dispense:  2 mL    Refill:  0   DISCONTD: Semaglutide-Weight Management (WEGOVY) 0.5 MG/0.5ML SOAJ    Sig: Inject 0.5 mg into the skin once a week.    Dispense:  2 mL    Refill:  1    No orders of the defined types were placed in this encounter.   Patient Instructions  Stop phentermine as BP was elevated today.  Re-price out Wegovy vs Zepbound.  Wegovy starting (0.25mg ) and continuing month (0.5mg ) supply sent to local pharmacy. You may google wegovy discount card.  If unaffordable, check on below pharmacy for Zepbound weekly weight loss injection:  [name] [place], [place] - [address] [phone]   Schedule office visit if medicine started for 4-6 weeks after starting.   Follow up plan: Return if symptoms worsen  or fail to improve.  [name], MD

## 2022-12-04 NOTE — Patient Instructions (Addendum)
Stop phentermine as BP was elevated today.  Re-price out Wegovy vs Zepbound.  Wegovy starting (0.25mg ) and continuing month (0.5mg ) supply sent to local pharmacy. You may google wegovy discount card.  If unaffordable, check on below pharmacy for Zepbound weekly weight loss injection:  [facility] Connor Herrera [place], [place] - [address] [phone]   Schedule office visit if medicine started for 4-6 weeks after starting.

## 2022-12-04 NOTE — Assessment & Plan Note (Signed)
BP elevation again noted today in setting of phentermine use. Stop phentermine, will reassess control at next visit.

## 2022-12-04 NOTE — Assessment & Plan Note (Addendum)
 Total 13 lb weight loss since starting phentermine late 08/2022.  BP elevation noted today, possibly phentermine effect. Will stop phentermine.   Did recommend re-pricing out Wegovy vs Zepbound.  He may be eligible for Zepbound coverage given concomitant OSA.  [name] to pharmacy. Provided with contact info for Zepbound direct manufacturer pharmacy to also price out.  If zepbound started, he will not take with another GLP1rA or GLP1/GIP containing medication.   Patient is interested in [place]. Reviewed mechanism of action of medication as well as side effects and adverse events to watch for including nausea, diarrhea, constipation, pancreatitis. No known fmhx medullary thyroid cancer or MEN2. Discussed titration schedule for medication. Will start wegovy 0.25mg  weekly x 1 month then increase to 0.5mg  weekly. Discussed need for regular visits for weight management to monitor medication effect and tolerance and weight loss, rec return 1 month after starting medication.    Medication will be used along with lifestyle changes/interventions to achieve sustainable weight loss.

## 2022-12-07 ENCOUNTER — Telehealth: Payer: Self-pay | Admitting: Pharmacist

## 2022-12-07 ENCOUNTER — Other Ambulatory Visit (HOSPITAL_COMMUNITY): Payer: Self-pay

## 2022-12-07 NOTE — Telephone Encounter (Signed)
Pharmacy Patient Advocate Encounter   Received notification from Physician's Office that prior authorization for Wegovy 0.25MG /0.5ML auto-injectors is required/requested.   Insurance verification completed.   The patient is insured through [facility] .   Per test claim: PA required; PA submitted to [facility] via CoverMyMeds Key/confirmation #/EOC  [id number] Status is pending

## 2022-12-07 NOTE — Telephone Encounter (Signed)
Pharmacy Patient Advocate Encounter  Received notification from [facility] that Prior Authorization for Wegovy 0.25MG /0.5ML auto-injectors has been DENIED.  See denial reason below. No denial letter attached in CMM. Will attache denial letter to Media tab once received.   PA #/Case ID/Reference #: [id number]

## 2022-12-08 NOTE — Telephone Encounter (Signed)
Called patient reviewed information with him. Per his request sent via my chart as well. He will reach out if any questions.

## 2022-12-08 NOTE — Telephone Encounter (Signed)
Lvm asking pt to call call back. Need to relay Dr Timoteo Expose message.

## 2022-12-08 NOTE — Telephone Encounter (Signed)
Plz notify pt - it seems his insurance won't cover [phone] unless his BMI is >35, and his is 31.  Would have him check on cost of Zepbound through below pharmacy: [facility] Herrera [place], [place] - [address] [phone]

## 2023-04-20 ENCOUNTER — Encounter: Payer: Self-pay | Admitting: Family Medicine

## 2023-04-25 MED ORDER — TIRZEPATIDE-WEIGHT MANAGEMENT 2.5 MG/0.5ML ~~LOC~~ SOLN: 2.5000 mg | SUBCUTANEOUS | 0 refills | Status: AC

## 2023-04-25 MED ORDER — TIRZEPATIDE-WEIGHT MANAGEMENT 5 MG/0.5ML ~~LOC~~ SOLN: 5.0000 mg | SUBCUTANEOUS | 1 refills | Status: AC

## 2023-04-25 NOTE — Telephone Encounter (Signed)
 ERx zepbound for OSA Price out for sleep apnea - G47.33

## 2023-05-02 ENCOUNTER — Telehealth: Payer: Self-pay

## 2023-05-02 ENCOUNTER — Other Ambulatory Visit (HOSPITAL_COMMUNITY): Payer: Self-pay

## 2023-05-02 NOTE — Telephone Encounter (Signed)
 Pharmacy Patient Advocate Encounter   Received notification from CoverMyMeds that prior authorization for Zepbound 2.5MG /0.5ML pen-injectors is required/requested.   Insurance verification completed.   The patient is insured through [facility] .   Per test claim: PA required; PA submitted to above mentioned insurance via CoverMyMeds Key/confirmation #/EOC [id number] Status is pending

## 2023-05-03 NOTE — Telephone Encounter (Signed)
 Pharmacy Patient Advocate Encounter  Received notification from [facility] that Prior Authorization for Zepbound 2.5MG /0.5ML pen-injectors  has been DENIED.  Full denial letter will be uploaded to the media tab. See denial reason below.   PA #/Case ID/Reference #: [id number]

## 2023-05-04 NOTE — Telephone Encounter (Addendum)
 Latest OV addended. Can we submit office visit from 11/2022 for requested documentation?  Thank you.

## 2023-05-04 NOTE — Assessment & Plan Note (Signed)
 Continues nightly CPAP use with benefit.

## 2023-05-09 ENCOUNTER — Telehealth: Payer: Self-pay | Admitting: Pharmacist

## 2023-05-09 NOTE — Telephone Encounter (Signed)
 Information has been sent to clinical pharmacist for appeals review. It may take 5-7 days to prepare the necessary documentation to request the appeal from the insurance.

## 2023-05-09 NOTE — Telephone Encounter (Signed)
 E-Appeal has been submitted for Zepbound. Will advise when response is received, please be advised that most companies may take 30 days to make a decision.  Appeal letter and supporting documentation have been uploaded and submitted via the [facility] Website on 05/09/2023 @10 :49 am.    Thank you, Dellie Burns, PharmD Clinical Pharmacist  [place]  Direct Dial: [phone]

## 2023-05-09 NOTE — Telephone Encounter (Signed)
 Appeal for Zepbound has been approved by the insurance:

## 2023-10-31 ENCOUNTER — Ambulatory Visit: Admitting: Physician Assistant

## 2023-12-17 ENCOUNTER — Encounter: Payer: Self-pay | Admitting: Radiology
